# Patient Record
Sex: Male | Born: 1960 | Race: Black or African American | Hispanic: No | Marital: Single | Smoking: Current every day smoker
Health system: Southern US, Community
[De-identification: ages and names within clinical notes are randomized; demographics above are authoritative.]

## PROBLEM LIST (undated history)

## (undated) DIAGNOSIS — K611 Rectal abscess: Secondary | ICD-10-CM

## (undated) DIAGNOSIS — K219 Gastro-esophageal reflux disease without esophagitis: Secondary | ICD-10-CM

## (undated) DIAGNOSIS — I10 Essential (primary) hypertension: Secondary | ICD-10-CM

## (undated) HISTORY — PX: NO PAST SURGERIES: SHX2092

## (undated) HISTORY — DX: Rectal abscess: K61.1

---

## 1998-10-25 ENCOUNTER — Emergency Department (HOSPITAL_COMMUNITY): Admission: EM | Admit: 1998-10-25 | Discharge: 1998-10-25 | Payer: Self-pay | Admitting: Emergency Medicine

## 2010-06-07 ENCOUNTER — Ambulatory Visit (HOSPITAL_COMMUNITY)
Admission: RE | Admit: 2010-06-07 | Discharge: 2010-06-07 | Disposition: A | Discharge status: Home / Self Care | Payer: Self-pay | Source: Ambulatory Visit | Attending: Family Medicine | Admitting: Family Medicine

## 2010-06-07 ENCOUNTER — Other Ambulatory Visit (HOSPITAL_COMMUNITY): Payer: Self-pay | Admitting: Family Medicine

## 2010-06-07 DIAGNOSIS — R52 Pain, unspecified: Secondary | ICD-10-CM

## 2010-06-07 DIAGNOSIS — R609 Edema, unspecified: Secondary | ICD-10-CM | POA: Insufficient documentation

## 2010-06-07 DIAGNOSIS — M79609 Pain in unspecified limb: Secondary | ICD-10-CM | POA: Insufficient documentation

## 2011-02-01 ENCOUNTER — Emergency Department (INDEPENDENT_AMBULATORY_CARE_PROVIDER_SITE_OTHER)
Admission: EM | Admit: 2011-02-01 | Discharge: 2011-02-01 | Disposition: A | Discharge status: Home / Self Care | Payer: Self-pay | Source: Home / Self Care | Attending: Family Medicine | Admitting: Family Medicine

## 2011-02-01 DIAGNOSIS — L0231 Cutaneous abscess of buttock: Secondary | ICD-10-CM

## 2011-02-01 DIAGNOSIS — L03317 Cellulitis of buttock: Secondary | ICD-10-CM

## 2011-02-01 HISTORY — DX: Essential (primary) hypertension: I10

## 2011-02-01 MED ORDER — DOXYCYCLINE HYCLATE 100 MG PO CAPS: 100.0000 mg | ORAL_CAPSULE | Freq: Two times a day (BID) | ORAL | Status: AC

## 2011-02-01 NOTE — ED Notes (Signed)
C/o 1 week duration of pain and swelling in area of his buttocks, and cannot stand it anymore; has ben treating e hot water soaks

## 2011-02-01 NOTE — ED Provider Notes (Signed)
History     CSN: [id number] Arrival date & time: 02/01/2011  1:47 PM   First MD Initiated Contact with Patient 02/01/11 1405      Chief Complaint  Patient presents with  . Recurrent Skin Infections    (Consider location/radiation/quality/duration/timing/severity/associated sxs/prior treatment) HPI  Past Medical History  Diagnosis Date  . Hypertension     History reviewed. No pertinent past surgical history.  History reviewed. No pertinent family history.  History  Substance Use Topics  . Smoking status: Current Everyday Smoker  . Smokeless tobacco: Not on file  . Alcohol Use: Yes      Review of Systems  Constitutional: Negative.   HENT: Negative.   Respiratory: Negative.   Cardiovascular: Negative.   Gastrointestinal: Negative.   Genitourinary: Negative.     Allergies  Review of patient's allergies indicates no known allergies.  Home Medications   Current Outpatient Rx  Name Route Sig Dispense Refill  . DOXYCYCLINE HYCLATE 100 MG PO CAPS Oral Take 1 capsule (100 mg total) by mouth 2 (two) times daily. 20 capsule 0    BP 158/102  Pulse 80  Temp(Src) 98.4 F (36.9 C) (Oral)  Resp 17  SpO2 99%  Physical Exam  Constitutional: He appears well-developed and well-nourished. No distress.  Cardiovascular: Normal rate.   Pulmonary/Chest: Effort normal.  Skin:       fluctuant abscess left buttock    ED Course  INCISION AND DRAINAGE Date/Time: 02/01/2011 2:48 PM Performed by: Randa Spike Authorized by: Randa Spike Consent: Verbal consent obtained. Risks and benefits: risks, benefits and alternatives were discussed Consent given by: patient Patient identity confirmed: verbally with patient and arm band Type: abscess Body area: anogenital (left buttock) Anesthesia: local infiltration Local anesthetic: lidocaine 2% with epinephrine Patient sedated: no Scalpel size: 11 Incision type: single straight Complexity: simple Drainage:  purulent Wound treatment: wound left open Patient tolerance: Patient tolerated the procedure well with no immediate complications. Comments: Culture collected. Gauze dressing applied   (including critical care time)   Labs Reviewed  CULTURE, ROUTINE-ABSCESS   No results found.   1. Abscess of buttock, left       MDM          Randa Spike, MD 02/01/11 1455

## 2011-02-04 LAB — CULTURE, ROUTINE-ABSCESS

## 2011-02-05 NOTE — ED Notes (Signed)
Labs and medications reviewed.  Abscess culture showed few staph. species ( coagulase neg.).  Pt. treated with Doxycycline and had an I and D done. Discussed with Esperanza Sheets PA and she said tx. is adequate. Vassie Moselle 02/05/2011

## 2012-06-28 ENCOUNTER — Encounter (HOSPITAL_COMMUNITY): Payer: Self-pay | Admitting: Adult Health

## 2012-06-28 ENCOUNTER — Emergency Department (HOSPITAL_COMMUNITY)
Admission: EM | Admit: 2012-06-28 | Discharge: 2012-06-29 | Disposition: A | Discharge status: Home / Self Care | Payer: Self-pay | Attending: Emergency Medicine | Admitting: Emergency Medicine

## 2012-06-28 DIAGNOSIS — R0989 Other specified symptoms and signs involving the circulatory and respiratory systems: Secondary | ICD-10-CM

## 2012-06-28 DIAGNOSIS — K219 Gastro-esophageal reflux disease without esophagitis: Secondary | ICD-10-CM | POA: Insufficient documentation

## 2012-06-28 DIAGNOSIS — I10 Essential (primary) hypertension: Secondary | ICD-10-CM | POA: Insufficient documentation

## 2012-06-28 DIAGNOSIS — R1310 Dysphagia, unspecified: Secondary | ICD-10-CM | POA: Insufficient documentation

## 2012-06-28 DIAGNOSIS — R6889 Other general symptoms and signs: Secondary | ICD-10-CM | POA: Insufficient documentation

## 2012-06-28 DIAGNOSIS — F17200 Nicotine dependence, unspecified, uncomplicated: Secondary | ICD-10-CM | POA: Insufficient documentation

## 2012-06-28 HISTORY — DX: Gastro-esophageal reflux disease without esophagitis: K21.9

## 2012-06-28 NOTE — ED Notes (Signed)
Reports a choking episode while eating chicken at work this evening, stated, "I just couldn't get it done. Now I feel better" VS WNL, airway clear.

## 2012-06-29 MED ORDER — FAMOTIDINE 20 MG PO TABS
20.0000 mg | ORAL_TABLET | Freq: Once | ORAL | Status: AC
  Administered 2012-06-29: 20 mg via ORAL
  Filled 2012-06-29: qty 1

## 2012-06-29 MED ORDER — FAMOTIDINE 40 MG PO TABS: 20.0000 mg | ORAL_TABLET | Freq: Two times a day (BID) | ORAL | Status: DC

## 2012-06-29 NOTE — ED Provider Notes (Signed)
History     CSN: [id number]  Arrival date & time 06/28/12  2112   First MD Initiated Contact with Patient 06/29/12 0007      Chief Complaint  Patient presents with  . Choking    (Consider location/radiation/quality/duration/timing/severity/associated sxs/prior treatment) HPI Comments: Earlier today, while eating chicken.  Patient and felt like he could not swallow.  It since that time has resolved.  He, states he has a history of gastric reflux, disease, for, which she's only been taking over-the-counter TUMS or Rolaids and he thinks that this may have been flared up.  He does not have a local doctor has never seen a GI specialist for this  He has had difficulty swallowing, food, and with similar presentation of feeling, like it stuck in either he vomits it out or it passes.  He's never had to come to the hospital before for symptoms  The history is provided by the patient.    Past Medical History  Diagnosis Date  . Hypertension   . GERD (gastroesophageal reflux disease)     History reviewed. No pertinent past surgical history.  History reviewed. No pertinent family history.  History  Substance Use Topics  . Smoking status: Current Every Day Smoker  . Smokeless tobacco: Not on file  . Alcohol Use: Yes      Review of Systems  Constitutional: Negative for fever.  HENT: Positive for trouble swallowing. Negative for congestion and sore throat.   Gastrointestinal: Negative for nausea, vomiting and abdominal pain.  All other systems reviewed and are negative.    Allergies  Review of patient's allergies indicates no known allergies.  Home Medications   Current Outpatient Rx  Name  Route  Sig  Dispense  Refill  . famotidine (PEPCID) 40 MG tablet   Oral   Take 0.5 tablets (20 mg total) by mouth 2 (two) times daily.   40 tablet   0     1 tab BID for 7 days than daily     BP 150/94  Pulse 77  Temp(Src) 98.6 F (37 C) (Oral)  Resp 18  SpO2 98%  Physical  Exam  Nursing note and vitals reviewed. Constitutional: He is oriented to person, place, and time. He appears well-developed.  HENT:  Head: Normocephalic.  Mouth/Throat: Oropharynx is clear and moist.  Patient was given a glass of water, is able to drink without difficulty no choking episodes  Eyes: Pupils are equal, round, and reactive to light.  Neck: Normal range of motion.  Cardiovascular: Normal rate and regular rhythm.   Abdominal: Soft.  Musculoskeletal: Normal range of motion.  Lymphadenopathy:    He has no cervical adenopathy.  Neurological: He is alert and oriented to person, place, and time.  Skin: Skin is warm. No erythema.    ED Course  Procedures (including critical care time)  Labs Reviewed - No data to display No results found.   1. Choking episode   2. GERD (gastroesophageal reflux disease)       MDM   Patient is able to drink a full glass of water, without shortness of breath, or nausea.  I will start him on Pepcid twice a day for 1 week, then daily thereafter, and refer him.  GI for further evaluation        Arman Filter, NP 06/29/12 0109  Arman Filter, NP 06/29/12 0110

## 2012-06-29 NOTE — ED Provider Notes (Signed)
Medical screening examination/treatment/procedure(s) were performed by non-physician practitioner and as supervising physician I was immediately available for consultation/collaboration.  Jones Skene, M.D.   Jones Skene, MD 06/29/12 [phone]

## 2016-07-24 ENCOUNTER — Ambulatory Visit (HOSPITAL_COMMUNITY)
Admission: EM | Admit: 2016-07-24 | Discharge: 2016-07-24 | Disposition: A | Discharge status: Home / Self Care | Payer: PRIVATE HEALTH INSURANCE | Attending: Family Medicine | Admitting: Family Medicine

## 2016-07-24 ENCOUNTER — Encounter (HOSPITAL_COMMUNITY): Payer: Self-pay | Admitting: Emergency Medicine

## 2016-07-24 DIAGNOSIS — L0291 Cutaneous abscess, unspecified: Secondary | ICD-10-CM

## 2016-07-24 MED ORDER — DOXYCYCLINE HYCLATE 100 MG PO TABS: 100.0000 mg | ORAL_TABLET | Freq: Two times a day (BID) | ORAL | 0 refills | Status: DC

## 2016-07-24 MED ORDER — LIDOCAINE-EPINEPHRINE (PF) 2 %-1:200000 IJ SOLN
INTRAMUSCULAR | Status: AC
  Filled 2016-07-24: qty 20

## 2016-07-24 NOTE — Discharge Instructions (Signed)
Please return in 1 to 2 days to recheck the infected area.  Please sit in warm water every few hours today and tomorrow.

## 2016-07-24 NOTE — ED Triage Notes (Signed)
[facility] a [facility] possible abscess on his left buttock x 4 days.

## 2016-07-24 NOTE — ED Provider Notes (Signed)
[facility]    CSN: [id number] Arrival date & time: 07/24/16  1252     History   Chief Complaint Chief Complaint  Patient presents with  . Abscess    HPI Erik Clay is a 56 y.o. male.   56 yo man with recurrence of perineal abscess over the last 3 days.      Past Medical History:  Diagnosis Date  . GERD (gastroesophageal reflux disease)   . Hypertension     There are no active problems to display for this patient.   History reviewed. No pertinent surgical history.     Home Medications    Prior to Admission medications   Medication Sig Start Date End Date Taking? Authorizing Provider  doxycycline (VIBRA-TABS) 100 MG tablet Take 1 tablet (100 mg total) by mouth 2 (two) times daily. 07/24/16   Elvina Sidle, MD    Family History History reviewed. No pertinent family history.  Social History Social History  Substance Use Topics  . Smoking status: Current Every Day Smoker  . Smokeless tobacco: Not on file  . Alcohol use Yes     Allergies   Patient has no known allergies.   Review of Systems Review of Systems  Genitourinary: Positive for scrotal swelling.  All other systems reviewed and are negative.    Physical Exam Triage Vital Signs ED Triage Vitals  Enc Vitals Group     BP 07/24/16 1335 (!) 175/108     Pulse Rate 07/24/16 1335 90     Resp 07/24/16 1335 18     Temp 07/24/16 1335 98.7 F (37.1 C)     Temp Source 07/24/16 1335 Oral     SpO2 07/24/16 1335 100 %     Weight --      Height --      Head Circumference --      Peak Flow --      Pain Score 07/24/16 1333 10     Pain Loc --      Pain Edu? --      Excl. in GC? --    No data found.   Updated Vital Signs BP (!) 175/108 (BP Location: Left Arm)   Pulse 90   Temp 98.7 F (37.1 C) (Oral)   Resp 18   SpO2 100%    Physical Exam  Constitutional: He is oriented to person, place, and time. He appears well-developed and well-nourished.  Pulmonary/Chest:  Effort normal.  Genitourinary: Rectum normal.  Genitourinary Comments: Left perianal induration, nontender  Musculoskeletal: Normal range of motion.  Neurological: He is alert and oriented to person, place, and time.  Skin:  Swollen tender left perineum  Nursing note and vitals reviewed.    UC Treatments / Results  Labs (all labs ordered are listed, but only abnormal results are displayed) Labs Reviewed - No data to display  EKG  EKG Interpretation None       Radiology No results found.  Procedures .Marland KitchenIncision and Drainage Date/Time: 07/24/2016 2:01 PM Performed by: Elvina Sidle Authorized by: Elvina Sidle   Consent:    Consent obtained:  Verbal   Consent given by:  Patient   Risks discussed:  Infection   Alternatives discussed:  No treatment Location:    Type:  Abscess   Location:  Anogenital   Anogenital location:  Perineum Pre-procedure details:    Skin preparation:  Antiseptic wash Anesthesia (see MAR for exact dosages):    Anesthesia method:  Local infiltration   Local anesthetic:  Lidocaine 2% WITH epi Procedure type:    Complexity:  Simple Procedure details:    Needle aspiration: yes     Needle size:  18 G   Incision depth:  Subcutaneous   Wound management:  Debrided   Drainage:  Purulent   Drainage amount:  Copious   Wound treatment:  Wound left open   Packing materials:  None Post-procedure details:    Patient tolerance of procedure:  Tolerated well, no immediate complications   (including critical care time)  Medications Ordered in UC Medications - No data to display   Initial Impression / Assessment and Plan / UC Course  I have reviewed the triage vital signs and the nursing notes.  Pertinent labs & imaging results that were available during my care of the patient were reviewed by me and considered in my medical decision making (see chart for details).     Final Clinical Impressions(s) / UC Diagnoses   Final diagnoses:    Abscess    New Prescriptions New Prescriptions   DOXYCYCLINE (VIBRA-TABS) 100 MG TABLET    Take 1 tablet (100 mg total) by mouth 2 (two) times daily.     Elvina SidleLauenstein, Quill Grinder, MD 07/24/16 [phone]

## 2016-10-17 ENCOUNTER — Ambulatory Visit (HOSPITAL_COMMUNITY)
Admission: EM | Admit: 2016-10-17 | Discharge: 2016-10-17 | Disposition: A | Discharge status: Home / Self Care | Payer: PRIVATE HEALTH INSURANCE | Attending: Family Medicine | Admitting: Family Medicine

## 2016-10-17 ENCOUNTER — Encounter (HOSPITAL_COMMUNITY): Payer: Self-pay | Admitting: Emergency Medicine

## 2016-10-17 DIAGNOSIS — L0291 Cutaneous abscess, unspecified: Secondary | ICD-10-CM

## 2016-10-17 MED ORDER — DOXYCYCLINE HYCLATE 100 MG PO TABS: 100.0000 mg | ORAL_TABLET | Freq: Two times a day (BID) | ORAL | 0 refills | Status: DC

## 2016-10-17 NOTE — ED Triage Notes (Signed)
PT has recurrent abscess on left buttocks. This flare up has been going on for 2 days.

## 2016-10-20 ENCOUNTER — Encounter (HOSPITAL_COMMUNITY): Payer: Self-pay | Admitting: Family Medicine

## 2016-10-20 ENCOUNTER — Ambulatory Visit (HOSPITAL_COMMUNITY)
Admission: EM | Admit: 2016-10-20 | Discharge: 2016-10-20 | Disposition: A | Discharge status: Home / Self Care | Payer: PRIVATE HEALTH INSURANCE | Attending: Family Medicine | Admitting: Family Medicine

## 2016-10-20 DIAGNOSIS — I10 Essential (primary) hypertension: Secondary | ICD-10-CM

## 2016-10-20 DIAGNOSIS — L02215 Cutaneous abscess of perineum: Secondary | ICD-10-CM | POA: Diagnosis not present

## 2016-10-20 MED ORDER — AMLODIPINE BESYLATE 5 MG PO TABS: 5.0000 mg | ORAL_TABLET | Freq: Every day | ORAL | 3 refills | Status: DC

## 2016-10-20 NOTE — ED Triage Notes (Addendum)
Pt here for abscess follow up. Pt  Hypertensive with hx of HTN. sts that he hasn't had a medication in years. sts that he is taking abx from last abscess he had.

## 2016-10-20 NOTE — ED Provider Notes (Signed)
[facility]    CSN: [id number] Arrival date & time: 10/20/16  1203     History   Chief Complaint Chief Complaint  Patient presents with  . Follow-up    HPI Erik Clay is a 56 y.o. male.   Pt here for abscess follow up. Pt  Hypertensive with hx of HTN. sts that he hasn't had a medication in years. sts that he is taking abx from last abscess he had.   Patient would also like treatment for his high blood pressure. He knows that he's had elevated pressures and needs to be on medication. He does not have a primary care doctor and would like a referral.  Patient has no shortness of breath, edema, chest pain      Past Medical History:  Diagnosis Date  . GERD (gastroesophageal reflux disease)   . Hypertension     There are no active problems to display for this patient.   History reviewed. No pertinent surgical history.     Home Medications    Prior to Admission medications   Medication Sig Start Date End Date Taking? Authorizing Provider  amLODipine (NORVASC) 5 MG tablet Take 1 tablet (5 mg total) by mouth daily. 10/20/16   Elvina Sidle, MD  doxycycline (VIBRA-TABS) 100 MG tablet Take 1 tablet (100 mg total) by mouth 2 (two) times daily. 10/17/16   Mardella Layman, MD    Family History History reviewed. No pertinent family history.  Social History Social History  Substance Use Topics  . Smoking status: Current Every Day Smoker    Packs/day: 0.50    Types: Cigarettes  . Smokeless tobacco: Never Used  . Alcohol use Yes     Allergies   Patient has no known allergies.   Review of Systems Review of Systems  All other systems reviewed and are negative.    Physical Exam Triage Vital Signs ED Triage Vitals [10/20/16 1239]  Enc Vitals Group     BP (!) 190/118     Pulse Rate 74     Resp 18     Temp 98.6 F (37 C)     Temp src      SpO2 98 %     Weight      Height      Head Circumference      Peak Flow      Pain Score      Pain  Loc      Pain Edu?      Excl. in GC?    No data found.   Updated Vital Signs BP (!) 190/118   Pulse 74   Temp 98.6 F (37 C)   Resp 18   SpO2 98%   Visual Acuity Right Eye Distance:   Left Eye Distance:   Bilateral Distance:    Right Eye Near:   Left Eye Near:    Bilateral Near:     Physical Exam  Constitutional: He is oriented to person, place, and time. He appears well-developed and well-nourished.  HENT:  Right Ear: External ear normal.  Left Ear: External ear normal.  Eyes:  Right esophoria  Neck: Normal range of motion. Neck supple.  Pulmonary/Chest: Effort normal.  Musculoskeletal: Normal range of motion.  Neurological: He is alert and oriented to person, place, and time.  Skin:  Packing removed revealing clean base and left perineum  Nursing note and vitals reviewed.    UC Treatments / Results  Labs (all labs ordered are listed, but only  abnormal results are displayed) Labs Reviewed - No data to display  EKG  EKG Interpretation None       Radiology No results found.  Procedures Procedures (including critical care time)  Medications Ordered in UC Medications - No data to display   Initial Impression / Assessment and Plan / UC Course  I have reviewed the triage vital signs and the nursing notes.  Pertinent labs & imaging results that were available during my care of the patient were reviewed by me and considered in my medical decision making (see chart for details).     Final Clinical Impressions(s) / UC Diagnoses   Final diagnoses:  Abscess, perineum  Essential hypertension    New Prescriptions New Prescriptions   AMLODIPINE (NORVASC) 5 MG TABLET    Take 1 tablet (5 mg total) by mouth daily.     Controlled Substance Prescriptions [place] Controlled Substance Registry consulted? Not Applicable   Elvina Sidle, MD 10/20/16 1304

## 2016-10-20 NOTE — Discharge Instructions (Signed)
I want you to shower well twice a day and wash the abscess site carefully with soap and water.  Please finish her antibiotics and return in one week.

## 2016-10-22 NOTE — ED Provider Notes (Signed)
  [facility]   [id number] 10/17/16 Arrival Time: 1820  ASSESSMENT & PLAN:  1. Abscess     Meds ordered this encounter  Medications  . doxycycline (VIBRA-TABS) 100 MG tablet    Sig: Take 1 tablet (100 mg total) by mouth 2 (two) times daily.    Dispense:  20 tablet    Refill:  0    Procedure: Verbal consent obtained. Area over induration cleaned with betadine. Lidocaine 2% without epinephrine used to obtain local anesthesia. The most fluctuant portion of the abscess was incised with a #11 blade scalpel. Abscess cavity explored and evacuated. All loculations were broken up with a curved hemostat as best as possible given patient discomfort. Cavity packed with packing material and dressed with a clean gauze dressing. Minimal bleeding. No complications. Start on antibiotics.  Wound care instructions discussed and given in written format. To return in 48 hours for wound check.  Prefers OTC analgesics.  Reviewed expectations re: course of current medical issues. Questions answered. Outlined signs and symptoms indicating need for more acute intervention. Patient verbalized understanding. After Visit Summary given.   SUBJECTIVE:  Erik Clay is a 56 y.o. male who presents with a possible abscess of left buttock. Similar in the past requiring I&D. Has noticed pain in area for 2 days. No drainage. Afebrile. Ambulatory.  ROS: As per HPI.   OBJECTIVE:  Vitals:   10/17/16 1902  BP: (!) 152/105  Pulse: 99  Resp: 20  Temp: (!) 100.5 F (38.1 C)  TempSrc: Temporal  SpO2: 97%  Weight: 135 lb (61.2 kg)  Height: 5\' 9"  (1.753 m)     General appearance: alert; appears to be in pain Skin: large abscess of lower L buttock and perineum; approx 8cm x 4cm; fluctuant and tender; no drainage; slight overlying erythema  Past Medical History:  Diagnosis Date  . GERD (gastroesophageal reflux disease)   . Hypertension     No Known Allergies  PMHx, SurgHx, SocialHx,  Medications, and Allergies were reviewed in the Visit Navigator and updated as appropriate.       Mardella Layman, MD 10/22/16 [phone]

## 2016-10-27 ENCOUNTER — Ambulatory Visit (HOSPITAL_COMMUNITY)
Admission: EM | Admit: 2016-10-27 | Discharge: 2016-10-27 | Disposition: A | Discharge status: Home / Self Care | Payer: PRIVATE HEALTH INSURANCE | Attending: Internal Medicine | Admitting: Internal Medicine

## 2016-10-27 ENCOUNTER — Encounter (HOSPITAL_COMMUNITY): Payer: Self-pay | Admitting: *Deleted

## 2016-10-27 DIAGNOSIS — L02215 Cutaneous abscess of perineum: Secondary | ICD-10-CM | POA: Diagnosis not present

## 2016-10-27 DIAGNOSIS — Z5189 Encounter for other specified aftercare: Secondary | ICD-10-CM | POA: Diagnosis not present

## 2016-10-27 NOTE — ED Triage Notes (Signed)
Presents for f/u of abscess and new HTN meds.  States abscess is doing much better; taking abx as prescribed.  Also taking HTN med and has new f/u with PCP scheduled for next week.

## 2016-10-27 NOTE — ED Provider Notes (Signed)
[facility]    CSN: [id number] Arrival date & time: 10/27/16  1255     History   Chief Complaint Chief Complaint  Patient presents with  . Follow-up    HPI Erik Clay is a 56 y.o. male.   56 year old male comes in for follow-up of perineum abscess wound recheck as well as hypertension. Patient states he has been taking antibiotics as directed. Continues to keep wound clean and dry. Has not noticed any discharge, increased warmth, increased pain. States he has been feeling a lot better, and does not have any pain. Denies fever, chills, night sweats. He has also been taking amlodipine as directed, and has an appointment with PCP. He denies any chest pain, shortness of breath, palpitations, dizziness, weakness, leg swelling.      Past Medical History:  Diagnosis Date  . GERD (gastroesophageal reflux disease)   . Hypertension     There are no active problems to display for this patient.   History reviewed. No pertinent surgical history.     Home Medications    Prior to Admission medications   Medication Sig Start Date End Date Taking? Authorizing Provider  amLODipine (NORVASC) 5 MG tablet Take 1 tablet (5 mg total) by mouth daily. 10/20/16   Elvina Sidle, MD  doxycycline (VIBRA-TABS) 100 MG tablet Take 1 tablet (100 mg total) by mouth 2 (two) times daily. 10/17/16   Mardella Layman, MD    Family History No family history on file.  Social History Social History  Substance Use Topics  . Smoking status: Current Every Day Smoker    Packs/day: 0.50    Types: Cigarettes  . Smokeless tobacco: Never Used  . Alcohol use Yes     Allergies   Patient has no known allergies.   Review of Systems Review of Systems  Reason unable to perform ROS: See HPI as above.     Physical Exam Triage Vital Signs ED Triage Vitals  Enc Vitals Group     BP 10/27/16 1310 (!) 152/101     Pulse Rate 10/27/16 1310 76     Resp 10/27/16 1310 16     Temp 10/27/16  1310 98.7 F (37.1 C)     Temp Source 10/27/16 1310 Oral     SpO2 10/27/16 1310 96 %     Weight --      Height --      Head Circumference --      Peak Flow --      Pain Score 10/27/16 1311 0     Pain Loc --      Pain Edu? --      Excl. in GC? --    No data found.   Updated Vital Signs BP (!) 152/101   Pulse 76   Temp 98.7 F (37.1 C) (Oral)   Resp 16   SpO2 96%     Physical Exam  Constitutional: He is oriented to person, place, and time. He appears well-developed and well-nourished. No distress.  Neurological: He is alert and oriented to person, place, and time.  Skin: Skin is warm and dry.  1cm incision site at the perineum. Wound is clean and dry. No obvious swelling, erythema, warmth. No pain on palpation.      UC Treatments / Results  Labs (all labs ordered are listed, but only abnormal results are displayed) Labs Reviewed - No data to display  EKG  EKG Interpretation None       Radiology No results found.  Procedures Procedures (including critical care time)  Medications Ordered in UC Medications - No data to display   Initial Impression / Assessment and Plan / UC Course  I have reviewed the triage vital signs and the nursing notes.  Pertinent labs & imaging results that were available during my care of the patient were reviewed by me and considered in my medical decision making (see chart for details).    Incision site for abscess healing well. No signs of infection. Patient to finish antibiotics as directed. Patient with significantly improved blood pressure, follow up with PCP as   scheduled for further management. Return precautions given.  Final Clinical Impressions(s) / UC Diagnoses   Final diagnoses:  Wound check, abscess    New Prescriptions Discharge Medication List as of 10/27/2016  1:22 PM         Belinda Fisher, PA-C 10/27/16 1343

## 2016-10-27 NOTE — Discharge Instructions (Signed)
Your incision site is healing nicely without signs of infection. Continue wound care as directed. Continue antibiotic as directed. If experiencing worsening of symptoms, fever, spreading erythema, increased warmth, increased pain, follow-up for reevaluation. Otherwise wound check with PCP as scheduled.  Your blood pressure has significantly improved since last visit. Continue medication as directed and follow up as scheduled with PCP. If experiencing headache, dizziness, weakness, chest pain, shortness of breath, follow-up for reevaluation. Otherwise follow-up with PCP for further management of hypertension.

## 2016-11-01 ENCOUNTER — Encounter: Payer: Self-pay | Admitting: Physician Assistant

## 2016-11-01 ENCOUNTER — Ambulatory Visit: Payer: PRIVATE HEALTH INSURANCE | Attending: Internal Medicine | Admitting: Physician Assistant

## 2016-11-01 VITALS — BP 125/83 | HR 86 | Temp 98.2°F | Resp 18 | Ht 69.0 in | Wt 107.0 lb

## 2016-11-01 DIAGNOSIS — K219 Gastro-esophageal reflux disease without esophagitis: Secondary | ICD-10-CM | POA: Insufficient documentation

## 2016-11-01 DIAGNOSIS — L02215 Cutaneous abscess of perineum: Secondary | ICD-10-CM | POA: Diagnosis not present

## 2016-11-01 DIAGNOSIS — I10 Essential (primary) hypertension: Secondary | ICD-10-CM | POA: Diagnosis not present

## 2016-11-01 DIAGNOSIS — F17200 Nicotine dependence, unspecified, uncomplicated: Secondary | ICD-10-CM | POA: Diagnosis not present

## 2016-11-01 DIAGNOSIS — L0291 Cutaneous abscess, unspecified: Secondary | ICD-10-CM | POA: Diagnosis present

## 2016-11-01 NOTE — Progress Notes (Signed)
Erik Clay  ZOX:[id number]  [id number]  DOB - 17-Nov-1960  Chief Complaint  Patient presents with  . Abscess  . Hypertension       Subjective:   Erik Clay is a 56 y.o. male here today for establishment of care. He has a history of hypertension, smoking and gastroesophageal reflux disease. He has not seen a primary care provider in 2-3 years secondary to lack of insurance. In May 2018 he presented to urgent care with complaints of pain in the perennial area. His testicles were swollen. He was diagnosed with an abscess. He was started on doxycycline. He was noted to have a systolic blood pressure 175.  He returned to urgent care 10/17/2016 with the same complaint. This time he was febrile at 100.5. His systolic blood pressure was 152. He underwent incision and drainage and was started on over-the-counter pain medications and antibiotics again. He was told to follow-up in 3 days.  On 10/20/2016 he presented for follow-up and his wound was stable. His systolic blood pressure was noted to be 190. He was started on Norvasc 5 mg daily and told to get a primary care provider. He also was Told to return in 2 weeks for repeat wound check.  On 10/27/2016 he returned for his wound check and to have his blood pressure repeated. His wound looked good. His systolic blood pressure come down to 152. No changes were made to his medication regimen.  He states that he feels great. His perennial area is okay. No fevers or chills. Has 4 tablets of antibiotics left. Pain is controlled. No chest pain. No headaches. Compliant with his blood pressure medications. Trying to eat a lower salt diet.   ROS: GEN: denies fever or chills, denies change in weight Skin: denies lesions or rashes HEENT: denies headache, earache, epistaxis, sore throat, or neck pain LUNGS: denies SHOB, dyspnea, PND, orthopnea CV: denies CP or palpitations ABD: denies abd pain, N or V EXT: denies muscle spasms or swelling; no  pain in lower ext, no weakness NEURO: denies numbness or tingling, denies sz, stroke or TIA   ALLERGIES: No Known Allergies  PAST MEDICAL HISTORY: Past Medical History:  Diagnosis Date  . GERD (gastroesophageal reflux disease)   . Hypertension     PAST SURGICAL HISTORY: History reviewed. No pertinent surgical history.  MEDICATIONS AT HOME: Prior to Admission medications   Medication Sig Start Date End Date Taking? Authorizing Provider  amLODipine (NORVASC) 5 MG tablet Take 1 tablet (5 mg total) by mouth daily. 10/20/16  Yes Elvina Sidle, MD  doxycycline (VIBRA-TABS) 100 MG tablet Take 1 tablet (100 mg total) by mouth 2 (two) times daily. 10/17/16  Yes Mardella Layman, MD   Family hx-noncontributory Social hx-unmarried, no children, 1/2 ppd cigarettes  Objective:   Vitals:   11/01/16 1500  BP: 125/83  Pulse: 86  Resp: 18  Temp: 98.2 F (36.8 C)  TempSrc: Oral  SpO2: 95%  Weight: 107 lb (48.5 kg)  Height: 5\' 9"  (1.753 m)    Exam-benign General appearance : Awake, alert, not in any distress. Speech Clear. Not toxic looking HEENT: Atraumatic and Normocephalic, pupils equally reactive to light and accomodation Chest:Good air entry bilaterally, no added sounds  CVS: S1 S2 regular, no murmurs.  Extremities: B/L Lower Ext shows no edema, both legs are warm to touch Neurology: Awake alert, and oriented X 3, CN II-XII intact, Non focal Skin:No Rash Wounds:C & D   Assessment & Plan  1. Perineal abscess  -cont  to shower BID and keep clean and dry  -Complete course of antibiotics 2. HTN  -DASH diet   -cont Norvasc 5 mg daily  -recheck BP with nurse in 2 weeks 3. Smoker  -not ready to QUIT  -cessation encouraged    Return in about 2 weeks (around 11/15/2016).  The patient was given clear instructions to go to ER or return to medical center if symptoms don't improve, worsen or new problems develop. The patient verbalized understanding. The patient was told to call  to get lab results if they haven't heard anything in the next week.   This note has been created with Education officer, environmentalDragon speech recognition software and smart phrase technology. Any transcriptional errors are unintentional.    Scot Juniffany Akiva Josey, PA-C [facility] [place], [place] [phone]   11/01/2016, 3:05 PM

## 2016-11-01 NOTE — Patient Instructions (Signed)
 Hypertension Hypertension, commonly called high blood pressure, is when the force of blood pumping through the arteries is too strong. The arteries are the blood vessels that carry blood from the heart throughout the body. Hypertension forces the heart to work harder to pump blood and may cause arteries to become narrow or stiff. Having untreated or uncontrolled hypertension can cause heart attacks, strokes, kidney disease, and other problems. A blood pressure reading consists of a higher number over a lower number. Ideally, your blood pressure should be below 120/80. The first ("top") number is called the systolic pressure. It is a measure of the pressure in your arteries as your heart beats. The second ("bottom") number is called the diastolic pressure. It is a measure of the pressure in your arteries as the heart relaxes. What are the causes? The cause of this condition is not known. What increases the risk? Some risk factors for high blood pressure are under your control. Others are not. Factors you can change  Smoking.  Having type 2 diabetes mellitus, high cholesterol, or both.  Not getting enough exercise or physical activity.  Being overweight.  Having too much fat, sugar, calories, or salt (sodium) in your diet.  Drinking too much alcohol. Factors that are difficult or impossible to change  Having chronic kidney disease.  Having a family history of high blood pressure.  Age. Risk increases with age.  Race. You may be at higher risk if you are African-American.  Gender. Men are at higher risk than women before age 45. After age 65, women are at higher risk than men.  Having obstructive sleep apnea.  Stress. What are the signs or symptoms? Extremely high blood pressure (hypertensive crisis) may cause:  Headache.  Anxiety.  Shortness of breath.  Nosebleed.  Nausea and vomiting.  Severe chest pain.  Jerky movements you cannot control (seizures).  How is  this diagnosed? This condition is diagnosed by measuring your blood pressure while you are seated, with your arm resting on a surface. The cuff of the blood pressure monitor will be placed directly against the skin of your upper arm at the level of your heart. It should be measured at least twice using the same arm. Certain conditions can cause a difference in blood pressure between your right and left arms. Certain factors can cause blood pressure readings to be lower or higher than normal (elevated) for a short period of time:  When your blood pressure is higher when you are in a health care provider's office than when you are at home, this is called white coat hypertension. Most people with this condition do not need medicines.  When your blood pressure is higher at home than when you are in a health care provider's office, this is called masked hypertension. Most people with this condition may need medicines to control blood pressure.  If you have a high blood pressure reading during one visit or you have normal blood pressure with other risk factors:  You may be asked to return on a different day to have your blood pressure checked again.  You may be asked to monitor your blood pressure at home for 1 week or longer.  If you are diagnosed with hypertension, you may have other blood or imaging tests to help your health care provider understand your overall risk for other conditions. How is this treated? This condition is treated by making healthy lifestyle changes, such as eating healthy foods, exercising more, and reducing your alcohol intake.   Your health care provider may prescribe medicine if lifestyle changes are not enough to get your blood pressure under control, and if:  Your systolic blood pressure is above 130.  Your diastolic blood pressure is above 80.  Your personal target blood pressure may vary depending on your medical conditions, your age, and other factors. Follow these  instructions at home: Eating and drinking  Eat a diet that is high in fiber and potassium, and low in sodium, added sugar, and fat. An example eating plan is called the DASH (Dietary Approaches to Stop Hypertension) diet. To eat this way: ? Eat plenty of fresh fruits and vegetables. Try to fill half of your plate at each meal with fruits and vegetables. ? Eat whole grains, such as whole wheat pasta, brown rice, or whole grain bread. Fill about one quarter of your plate with whole grains. ? Eat or drink low-fat dairy products, such as skim milk or low-fat yogurt. ? Avoid fatty cuts of meat, processed or cured meats, and poultry with skin. Fill about one quarter of your plate with lean proteins, such as fish, chicken without skin, beans, eggs, and tofu. ? Avoid premade and processed foods. These tend to be higher in sodium, added sugar, and fat.  Reduce your daily sodium intake. Most people with hypertension should eat less than 1,500 mg of sodium a day.  Limit alcohol intake to no more than 1 drink a day for nonpregnant women and 2 drinks a day for men. One drink equals 12 oz of beer, 5 oz of wine, or 1 oz of hard liquor. Lifestyle  Work with your health care provider to maintain a healthy body weight or to lose weight. Ask what an ideal weight is for you.  Get at least 30 minutes of exercise that causes your heart to beat faster (aerobic exercise) most days of the week. Activities may include walking, swimming, or biking.  Include exercise to strengthen your muscles (resistance exercise), such as pilates or lifting weights, as part of your weekly exercise routine. Try to do these types of exercises for 30 minutes at least 3 days a week.  Do not use any products that contain nicotine or tobacco, such as cigarettes and e-cigarettes. If you need help quitting, ask your health care provider.  Monitor your blood pressure at home as told by your health care provider.  Keep all follow-up visits as  told by your health care provider. This is important. Medicines  Take over-the-counter and prescription medicines only as told by your health care provider. Follow directions carefully. Blood pressure medicines must be taken as prescribed.  Do not skip doses of blood pressure medicine. Doing this puts you at risk for problems and can make the medicine less effective.  Ask your health care provider about side effects or reactions to medicines that you should watch for. Contact a health care provider if:  You think you are having a reaction to a medicine you are taking.  You have headaches that keep coming back (recurring).  You feel dizzy.  You have swelling in your ankles.  You have trouble with your vision. Get help right away if:  You develop a severe headache or confusion.  You have unusual weakness or numbness.  You feel faint.  You have severe pain in your chest or abdomen.  You vomit repeatedly.  You have trouble breathing. Summary  Hypertension is when the force of blood pumping through your arteries is too strong. If this condition is   not controlled, it may put you at risk for serious complications.  Your personal target blood pressure may vary depending on your medical conditions, your age, and other factors. For most people, a normal blood pressure is less than 120/80.  Hypertension is treated with lifestyle changes, medicines, or a combination of both. Lifestyle changes include weight loss, eating a healthy, low-sodium diet, exercising more, and limiting alcohol. This information is not intended to replace advice given to you by your health care provider. Make sure you discuss any questions you have with your health care provider. Document Released: 02/18/2005 Document Revised: 01/17/2016 Document Reviewed: 01/17/2016 Elsevier Interactive Patient Education  2018 Elsevier Inc.   DASH Eating Plan DASH stands for "Dietary Approaches to Stop Hypertension." The  DASH eating plan is a healthy eating plan that has been shown to reduce high blood pressure (hypertension). It may also reduce your risk for type 2 diabetes, heart disease, and stroke. The DASH eating plan may also help with weight loss. What are tips for following this plan? General guidelines  Avoid eating more than 2,300 mg (milligrams) of salt (sodium) a day. If you have hypertension, you may need to reduce your sodium intake to 1,500 mg a day.  Limit alcohol intake to no more than 1 drink a day for nonpregnant women and 2 drinks a day for men. One drink equals 12 oz of beer, 5 oz of wine, or 1 oz of hard liquor.  Work with your health care provider to maintain a healthy body weight or to lose weight. Ask what an ideal weight is for you.  Get at least 30 minutes of exercise that causes your heart to beat faster (aerobic exercise) most days of the week. Activities may include walking, swimming, or biking.  Work with your health care provider or diet and nutrition specialist (dietitian) to adjust your eating plan to your individual calorie needs. Reading food labels  Check food labels for the amount of sodium per serving. Choose foods with less than 5 percent of the Daily Value of sodium. Generally, foods with less than 300 mg of sodium per serving fit into this eating plan.  To find whole grains, look for the word "whole" as the first word in the ingredient list. Shopping  Buy products labeled as "low-sodium" or "no salt added."  Buy fresh foods. Avoid canned foods and premade or frozen meals. Cooking  Avoid adding salt when cooking. Use salt-free seasonings or herbs instead of table salt or sea salt. Check with your health care provider or pharmacist before using salt substitutes.  Do not fry foods. Cook foods using healthy methods such as baking, boiling, grilling, and broiling instead.  Cook with heart-healthy oils, such as olive, canola, soybean, or sunflower oil. Meal  planning   Eat a balanced diet that includes: ? 5 or more servings of fruits and vegetables each day. At each meal, try to fill half of your plate with fruits and vegetables. ? Up to 6-8 servings of whole grains each day. ? Less than 6 oz of lean meat, poultry, or fish each day. A 3-oz serving of meat is about the same size as a deck of cards. One egg equals 1 oz. ? 2 servings of low-fat dairy each day. ? A serving of nuts, seeds, or beans 5 times each week. ? Heart-healthy fats. Healthy fats called Omega-3 fatty acids are found in foods such as flaxseeds and coldwater fish, like sardines, salmon, and mackerel.  Limit how much you   eat of the following: ? Canned or prepackaged foods. ? Food that is high in trans fat, such as fried foods. ? Food that is high in saturated fat, such as fatty meat. ? Sweets, desserts, sugary drinks, and other foods with added sugar. ? Full-fat dairy products.  Do not salt foods before eating.  Try to eat at least 2 vegetarian meals each week.  Eat more home-cooked food and less restaurant, buffet, and fast food.  When eating at a restaurant, ask that your food be prepared with less salt or no salt, if possible. What foods are recommended? The items listed may not be a complete list. Talk with your dietitian about what dietary choices are best for you. Grains Whole-grain or whole-wheat bread. Whole-grain or whole-wheat pasta. Brown rice. Oatmeal. Quinoa. Bulgur. Whole-grain and low-sodium cereals. Pita bread. Low-fat, low-sodium crackers. Whole-wheat flour tortillas. Vegetables Fresh or frozen vegetables (raw, steamed, roasted, or grilled). Low-sodium or reduced-sodium tomato and vegetable juice. Low-sodium or reduced-sodium tomato sauce and tomato paste. Low-sodium or reduced-sodium canned vegetables. Fruits All fresh, dried, or frozen fruit. Canned fruit in natural juice (without added sugar). Meat and other protein foods Skinless chicken or turkey.  Ground chicken or turkey. Pork with fat trimmed off. Fish and seafood. Egg whites. Dried beans, peas, or lentils. Unsalted nuts, nut butters, and seeds. Unsalted canned beans. Lean cuts of beef with fat trimmed off. Low-sodium, lean deli meat. Dairy Low-fat (1%) or fat-free (skim) milk. Fat-free, low-fat, or reduced-fat cheeses. Nonfat, low-sodium ricotta or cottage cheese. Low-fat or nonfat yogurt. Low-fat, low-sodium cheese. Fats and oils Soft margarine without trans fats. Vegetable oil. Low-fat, reduced-fat, or light mayonnaise and salad dressings (reduced-sodium). Canola, safflower, olive, soybean, and sunflower oils. Avocado. Seasoning and other foods Herbs. Spices. Seasoning mixes without salt. Unsalted popcorn and pretzels. Fat-free sweets. What foods are not recommended? The items listed may not be a complete list. Talk with your dietitian about what dietary choices are best for you. Grains Baked goods made with fat, such as croissants, muffins, or some breads. Dry pasta or rice meal packs. Vegetables Creamed or fried vegetables. Vegetables in a cheese sauce. Regular canned vegetables (not low-sodium or reduced-sodium). Regular canned tomato sauce and paste (not low-sodium or reduced-sodium). Regular tomato and vegetable juice (not low-sodium or reduced-sodium). Pickles. Olives. Fruits Canned fruit in a light or heavy syrup. Fried fruit. Fruit in cream or butter sauce. Meat and other protein foods Fatty cuts of meat. Ribs. Fried meat. Bacon. Sausage. Bologna and other processed lunch meats. Salami. Fatback. Hotdogs. Bratwurst. Salted nuts and seeds. Canned beans with added salt. Canned or smoked fish. Whole eggs or egg yolks. Chicken or turkey with skin. Dairy Whole or 2% milk, cream, and half-and-half. Whole or full-fat cream cheese. Whole-fat or sweetened yogurt. Full-fat cheese. Nondairy creamers. Whipped toppings. Processed cheese and cheese spreads. Fats and oils Butter. Stick  margarine. Lard. Shortening. Ghee. Bacon fat. Tropical oils, such as coconut, palm kernel, or palm oil. Seasoning and other foods Salted popcorn and pretzels. Onion salt, garlic salt, seasoned salt, table salt, and sea salt. Worcestershire sauce. Tartar sauce. Barbecue sauce. Teriyaki sauce. Soy sauce, including reduced-sodium. Steak sauce. Canned and packaged gravies. Fish sauce. Oyster sauce. Cocktail sauce. Horseradish that you find on the shelf. Ketchup. Mustard. Meat flavorings and tenderizers. Bouillon cubes. Hot sauce and Tabasco sauce. Premade or packaged marinades. Premade or packaged taco seasonings. Relishes. Regular salad dressings. Where to find more information:  National Heart, Lung, and Blood Institute: www.nhlbi.nih.gov  American   Heart Association: www.heart.org Summary  The DASH eating plan is a healthy eating plan that has been shown to reduce high blood pressure (hypertension). It may also reduce your risk for type 2 diabetes, heart disease, and stroke.  With the DASH eating plan, you should limit salt (sodium) intake to 2,300 mg a day. If you have hypertension, you may need to reduce your sodium intake to 1,500 mg a day.  When on the DASH eating plan, aim to eat more fresh fruits and vegetables, whole grains, lean proteins, low-fat dairy, and heart-healthy fats.  Work with your health care provider or diet and nutrition specialist (dietitian) to adjust your eating plan to your individual calorie needs. This information is not intended to replace advice given to you by your health care provider. Make sure you discuss any questions you have with your health care provider. Document Released: 02/07/2011 Document Revised: 02/12/2016 Document Reviewed: 02/12/2016 Elsevier Interactive Patient Education  2017 Elsevier Inc.  

## 2016-12-02 ENCOUNTER — Encounter: Payer: Self-pay | Admitting: Family Medicine

## 2016-12-02 ENCOUNTER — Ambulatory Visit: Payer: PRIVATE HEALTH INSURANCE | Attending: Family Medicine | Admitting: Family Medicine

## 2016-12-02 VITALS — BP 139/98 | HR 89 | Temp 98.4°F | Resp 18 | Ht 69.0 in | Wt 109.2 lb

## 2016-12-02 DIAGNOSIS — F17210 Nicotine dependence, cigarettes, uncomplicated: Secondary | ICD-10-CM | POA: Diagnosis not present

## 2016-12-02 DIAGNOSIS — K029 Dental caries, unspecified: Secondary | ICD-10-CM | POA: Diagnosis not present

## 2016-12-02 DIAGNOSIS — E785 Hyperlipidemia, unspecified: Secondary | ICD-10-CM | POA: Diagnosis not present

## 2016-12-02 DIAGNOSIS — Z23 Encounter for immunization: Secondary | ICD-10-CM | POA: Diagnosis not present

## 2016-12-02 DIAGNOSIS — K219 Gastro-esophageal reflux disease without esophagitis: Secondary | ICD-10-CM

## 2016-12-02 DIAGNOSIS — I10 Essential (primary) hypertension: Secondary | ICD-10-CM

## 2016-12-02 DIAGNOSIS — Z0000 Encounter for general adult medical examination without abnormal findings: Secondary | ICD-10-CM

## 2016-12-02 DIAGNOSIS — R1310 Dysphagia, unspecified: Secondary | ICD-10-CM

## 2016-12-02 DIAGNOSIS — H5500 Unspecified nystagmus: Secondary | ICD-10-CM

## 2016-12-02 MED ORDER — AMLODIPINE BESYLATE 10 MG PO TABS: 10.0000 mg | ORAL_TABLET | Freq: Every day | ORAL | 2 refills | Status: DC

## 2016-12-02 MED ORDER — OMEPRAZOLE 20 MG PO CPDR: 20.0000 mg | DELAYED_RELEASE_CAPSULE | Freq: Every day | ORAL | 2 refills | Status: DC

## 2016-12-02 MED FILL — ?OMEPRAZOLE DR 20 MG CAPSUL: 20 | 30 days supply | Qty: 30 | Fill #0

## 2016-12-02 NOTE — Progress Notes (Signed)
Patient is here for f/up  

## 2016-12-02 NOTE — Progress Notes (Signed)
Subjective:  Patient ID: Erik Clay, male    DOB: May 11, 1960  Age: 56 y.o. MRN: [id number]  CC: Establish Care   HPI Erik Clay presents to establish care and for follow up. History of HTN. He does not check BP at home. Cardiac symptoms none. Patient denies chest pain, chest pressure/discomfort, dyspnea, near-syncope, palpitations and syncope.  Cardiovascular risk factors: dyslipidemia, hypertension, male gender, sedentary lifestyle and smoking/ tobacco exposure. He smokes 1/2 pack of cigarettes per day. Use of agents associated with hypertension: none. History of target organ damage: none Patient complains of heartburn. This has been associated with belching and dysphagia. He complaints of dysphagias when eating meats.  He denies melena, midespigastric pain and shortness of breath. Symptoms have been present for several months. Medical therapy in the past has included proton pump inhibitors. 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 He reports his last ophthalmological exam was 2 years ago.       Outpatient Medications Prior to Visit  Medication Sig Dispense Refill  . doxycycline (VIBRA-TABS) 100 MG tablet Take 1 tablet (100 mg total) by mouth 2 (two) times daily. 20 tablet 0  . amLODipine (NORVASC) 5 MG tablet Take 1 tablet (5 mg total) by mouth daily. 90 tablet 3   No facility-administered medications prior to visit.     ROS Review of Systems  Constitutional: Negative.   HENT: Positive for trouble swallowing.   Eyes: Positive for visual disturbance.  Respiratory: Negative.   Cardiovascular: Negative.   Gastrointestinal:       Heartburn       Objective:  BP (!) 139/98   Pulse 89   Temp 98.4 F (36.9 C) (Oral)   Resp 18   Ht  (1.753 m)   Wt 109 lb 3.2 oz (49.5 kg)   SpO2 96%   BMI 16.13 kg/m   BP/Weight 12/02/2016 11/01/2016 10/27/2016  Systolic BP 139 125 152  Diastolic BP 98 83 101  Wt. (Lbs) 109.2 107 -  BMI 16.13 15.8 -     Physical Exam    Constitutional: He appears well-developed and well-nourished.  HENT:  Head: Normocephalic and atraumatic.  Right Ear: External ear normal.  Left Ear: External ear normal.  Nose: Nose normal.  Mouth/Throat: Oropharynx is clear and moist.  Eyes: Pupils are equal, round, and reactive to light. Conjunctivae are normal. Right eye exhibits abnormal extraocular motion and nystagmus. Left eye exhibits abnormal extraocular motion and nystagmus.  Neck: No JVD present.  Cardiovascular: Normal rate, regular rhythm, normal heart sounds and intact distal pulses.   Pulmonary/Chest: Effort normal and breath sounds normal.  Abdominal: Soft. Bowel sounds are normal. There is no tenderness.  Skin: Skin is warm and dry.  Nursing note and vitals reviewed.    Assessment & Plan:   1. Essential hypertension Schedule BP recheck in 2 weeks with nurse. If BP is greater than 90/60 (MAP 65 or greater) but not less than 130/80 may add dose of losartan 25 mg QD  and recheck in another 2 weeks.  - amLODipine (NORVASC) 10 MG tablet; Take 1 tablet (10 mg total) by mouth daily.  Dispense: 30 tablet; Refill: 2  2. Gastroesophageal reflux disease, esophagitis presence not specified  - omeprazole (PRILOSEC) 20 MG capsule; Take 1 capsule (20 mg total) by mouth daily.  Dispense: 30 capsule; Refill: 2 - Ambulatory referral to Gastroenterology  3. Dysphagia, unspecified type  - Ambulatory referral to Gastroenterology - SLP modified barium swallow; Future  4. Nystagmus Vision acuity  screen - Ambulatory referral to Ophthalmology - Tdap vaccine greater than or equal to 7yo IM  5. Dental decay  - Ambulatory referral to Dentistry  6. Needs flu shot  - Flu Vaccine QUAD 6+ mos PF IM (Fluarix Quad PF)  7. Healthcare maintenance  - Tdap vaccine greater than or equal to 7yo IM   Meds ordered this encounter  Medications  . omeprazole (PRILOSEC) 20 MG capsule    Sig: Take 1 capsule (20 mg total) by mouth daily.     Dispense:  30 capsule    Refill:  2    Order Specific Question:   Supervising Provider    Answer:   Quentin Angst [id number]  . amLODipine (NORVASC) 10 MG tablet    Sig: Take 1 tablet (10 mg total) by mouth daily.    Dispense:  30 tablet    Refill:  2    Order Specific Question:   Supervising Provider    Answer:   Quentin Angst [id number]    Follow-up: Return in about 2 weeks (around 12/16/2016) for BP check with Travia.   Lizbeth Bark FNP

## 2016-12-02 NOTE — Patient Instructions (Addendum)
Food Choices for Gastroesophageal Reflux Disease, Adult When you have gastroesophageal reflux disease (GERD), the foods you eat and your eating habits are very important. Choosing the right foods can help ease your discomfort. What guidelines do I need to follow?  Choose fruits, vegetables, whole grains, and low-fat dairy products.  Choose low-fat meat, fish, and poultry.  Limit fats such as oils, salad dressings, butter, nuts, and avocado.  Keep a food diary. This helps you identify foods that cause symptoms.  Avoid foods that cause symptoms. These may be different for everyone.  Eat small meals often instead of 3 large meals a day.  Eat your meals slowly, in a place where you are relaxed.  Limit fried foods.  Cook foods using methods other than frying.  Avoid drinking alcohol.  Avoid drinking large amounts of liquids with your meals.  Avoid bending over or lying down until 2-3 hours after eating. What foods are not recommended? These are some foods and drinks that may make your symptoms worse: Vegetables Tomatoes. Tomato juice. Tomato and spaghetti sauce. Chili peppers. Onion and garlic. Horseradish. Fruits Oranges, grapefruit, and lemon (fruit and juice). Meats High-fat meats, fish, and poultry. This includes hot dogs, ribs, ham, sausage, salami, and bacon. Dairy Whole milk and chocolate milk. Sour cream. Cream. Butter. Ice cream. Cream cheese. Drinks Coffee and tea. Bubbly (carbonated) drinks or energy drinks. Condiments Hot sauce. Barbecue sauce. Sweets/Desserts Chocolate and cocoa. Donuts. Peppermint and spearmint. Fats and Oils High-fat foods. This includes [place] fries and potato chips. Other Vinegar. Strong spices. This includes black pepper, white pepper, red pepper, cayenne, curry powder, cloves, ginger, and chili powder. The items listed above may not be a complete list of foods and drinks to avoid. Contact your dietitian for more information. This  information is not intended to replace advice given to you by your health care provider. Make sure you discuss any questions you have with your health care provider. Document Released: 08/20/2011 Document Revised: 07/27/2015 Document Reviewed: 12/23/2012 Elsevier Interactive Patient Education  2017 [employer]  Soft-Food Meal Plan A soft-food meal plan includes foods that are safe and easy to swallow. This meal plan typically is used:  If you are having trouble chewing or swallowing foods.  As a transition meal plan after only having had liquid meals for a long period.  What do I need to know about the soft-food meal plan? A soft-food meal plan includes tender foods that are soft and easy to chew and swallow. In most cases, bite-sized pieces of food are easier to swallow. A bite-sized piece is about  inch or smaller. Foods in this plan do not need to be ground or pureed. Foods that are very hard, crunchy, or sticky should be avoided. Also, breads, cereals, yogurts, and desserts with nuts, seeds, or fruits should be avoided. What foods can I eat? Grains Rice and wild rice. Moist bread, dressing, pasta, and noodles. Well-moistened dry or cooked cereals, such as farina (cooked wheat cereal), oatmeal, or grits. Biscuits, breads, muffins, pancakes, and waffles that have been well moistened. Vegetables Shredded lettuce. Cooked, tender vegetables, including potatoes without skins. Vegetable juices. Broths or creamed soups made with vegetables that are not stringy or chewy. Strained tomatoes (without seeds). Fruits Canned or well-cooked fruits. Soft (ripe), peeled fresh fruits, such as peaches, nectarines, kiwi, cantaloupe, honeydew melon, and watermelon (without seeds). Soft berries with small seeds, such as strawberries. Fruit juices (without pulp). Meats and Other Protein Sources Moist, tender, lean beef. Mutton. Lamb. Veal.  Chicken. [place]. Liver. Ham. Fish without bones. Eggs. Dairy Milk,  milk drinks, and cream. Plain cream cheese and cottage cheese. Plain yogurt. Sweets/Desserts Flavored gelatin desserts. Custard. Plain ice cream, frozen yogurt, sherbet, milk shakes, and malts. Plain cakes and cookies. Plain hard candy. Other Butter, margarine (without trans fat), and cooking oils. Mayonnaise. Cream sauces. Mild spices, salt, and sugar. Syrup, molasses, honey, and jelly. The items listed above may not be a complete list of recommended foods or beverages. Contact your dietitian for more options. What foods are not recommended? Grains Dry bread, toast, crackers that have not been moistened. Coarse or dry cereals, such as bran, granola, and shredded wheat. Tough or chewy crusty breads, such as [place] bread or baguettes. Vegetables Corn. Raw vegetables except shredded lettuce. Cooked vegetables that are tough or stringy. Tough, crisp, fried potatoes and potato skins. Fruits Fresh fruits with skins or seeds or both, such as apples, pears, or grapes. Stringy, high-pulp fruits, such as papaya, pineapple, coconut, or mango. Fruit leather, fruit roll-ups, and all dried fruits. Meats and Other Protein Sources Sausages and hot dogs. Meats with gristle. Fish with bones. Nuts, seeds, and chunky peanut or other nut butters. Sweets/Desserts Cakes or cookies that are very dry or chewy. The items listed above may not be a complete list of foods and beverages to avoid. Contact your dietitian for more information. This information is not intended to replace advice given to you by your health care provider. Make sure you discuss any questions you have with your health care provider. Document Released: 05/28/2007 Document Revised: 07/27/2015 Document Reviewed: 01/15/2013 Elsevier Interactive Patient Education  2017 [employer].

## 2016-12-08 DIAGNOSIS — I10 Essential (primary) hypertension: Secondary | ICD-10-CM | POA: Insufficient documentation

## 2016-12-08 DIAGNOSIS — K219 Gastro-esophageal reflux disease without esophagitis: Secondary | ICD-10-CM | POA: Insufficient documentation

## 2016-12-16 ENCOUNTER — Encounter: Payer: Self-pay | Admitting: Gastroenterology

## 2016-12-16 ENCOUNTER — Ambulatory Visit: Payer: PRIVATE HEALTH INSURANCE | Attending: Family Medicine | Admitting: *Deleted

## 2016-12-16 DIAGNOSIS — I10 Essential (primary) hypertension: Secondary | ICD-10-CM | POA: Diagnosis present

## 2016-12-16 MED ORDER — AMLODIPINE BESYLATE 10 MG PO TABS: 10.0000 mg | ORAL_TABLET | Freq: Every day | ORAL | 0 refills | Status: DC

## 2016-12-16 NOTE — Patient Instructions (Signed)
AmLODipine 10 MG tablet; Take 1 tablet (10 mg total) by mouth daily.   Cut back on smoking and salt intake.

## 2016-12-16 NOTE — Progress Notes (Signed)
Pt arrived to [facility] alert and oriented. Pt arrives in good spirits. Last OV 12/02/2016 with PCP.  Pt denies chest pain, SOB, HA, dizziness, or blurred vision.  Reviewed medication with patient. Pt states medication was taken this morning.  Manual blood pressure reading: 160/98   PLAN- Increase amLODipine (NORVASC) 10 MG tablet; Take 1 tablet (10 mg total) by mouth daily.  Dispense: 30 tablet; Refill: 2  Recheck BP in 2 weeks

## 2016-12-30 ENCOUNTER — Ambulatory Visit: Payer: PRIVATE HEALTH INSURANCE | Attending: Family Medicine | Admitting: *Deleted

## 2016-12-30 VITALS — BP 118/85 | HR 70 | Temp 98.2°F | Resp 18

## 2016-12-30 DIAGNOSIS — Z0489 Encounter for examination and observation for other specified reasons: Secondary | ICD-10-CM | POA: Diagnosis not present

## 2016-12-30 DIAGNOSIS — I10 Essential (primary) hypertension: Secondary | ICD-10-CM | POA: Diagnosis present

## 2016-12-30 MED FILL — AMLODIPINE BESYLATE 10 MG T: 10 | 30 days supply | Qty: 30 | Fill #0

## 2016-12-30 NOTE — Patient Instructions (Signed)
Thank you for visiting the clinic today. Please continue with current dose of 10 mg Amlodipine. A new script was sent to the pharmacy on 12/16/16 for 10 mg prescription.

## 2017-02-06 ENCOUNTER — Encounter: Payer: Self-pay | Admitting: Gastroenterology

## 2017-02-06 ENCOUNTER — Ambulatory Visit (INDEPENDENT_AMBULATORY_CARE_PROVIDER_SITE_OTHER): Payer: PRIVATE HEALTH INSURANCE | Admitting: Gastroenterology

## 2017-02-06 VITALS — BP 130/90 | HR 92 | Ht 68.25 in | Wt 115.0 lb

## 2017-02-06 DIAGNOSIS — R1310 Dysphagia, unspecified: Secondary | ICD-10-CM | POA: Diagnosis not present

## 2017-02-06 DIAGNOSIS — R1319 Other dysphagia: Secondary | ICD-10-CM

## 2017-02-06 DIAGNOSIS — R634 Abnormal weight loss: Secondary | ICD-10-CM | POA: Diagnosis not present

## 2017-02-06 DIAGNOSIS — K219 Gastro-esophageal reflux disease without esophagitis: Secondary | ICD-10-CM | POA: Diagnosis not present

## 2017-02-06 NOTE — Patient Instructions (Signed)
If you are age 54 or older, your body mass index should be between 23-30. Your Body mass index is 17.36 kg/m. If this is out of the aforementioned range listed, please consider follow up with your Primary Care Provider.  If you are age 11064 or younger, your body mass index should be between 19-25. Your Body mass index is 17.36 kg/m. If this is out of the aformentioned range listed, please consider follow up with your Primary Care Provider.   You have been scheduled for an endoscopy. Please follow written instructions given to you at your visit today. If you use inhalers (even only as needed), please bring them with you on the day of your procedure. Your physician has requested that you go to www.startemmi.com and enter the access code given to you at your visit today. This web site gives a general overview about your procedure. However, you should still follow specific instructions given to you by our office regarding your preparation for the procedure.  Thank you for choosing [facility]  Dr Amada JupiterHenry Danis III

## 2017-02-06 NOTE — Progress Notes (Signed)
[facility] Consult Note:  History: Erik LoganRichard A Clay 02/06/2017  Referring physician: Lizbeth BarkHairston, Mandesia R, FNP  Reason for consult/chief complaint: Dysphagia (feels like food is getting stuck) and Gastroesophageal Reflux   Subjective  HPI:  This is a 56 year old man referred by a local health clinic for chronic dysphagia.  He is a somewhat limited historian, and seems to describe feelings of food frequently stuck in the neck or upper chest for about the last year.  He does not seem to feel like it is getting worse.  Initially, he did not think that there is been any weight loss, but when I made mention of his poor muscle mass and extra notches he had made in his belt, he supposed that perhaps he has lost some weight lately.  His voice has not become hoarse, he does have frequent heartburn that seems lately better on omeprazole.  He has been a longtime smoker, he has at least a 40 ounces of beer daily for many years.  He does not have hemoptysis or hematemesis, but he does report frequent sputum production where he feels like phlegm is collecting in the lower part of his throat. He does not seem that he has had regular primary care until recently. His appetite is lately decreased for unknown reasons.  ROS:  Review of Systems  Constitutional: Negative for appetite change and unexpected weight change.  HENT: Negative for mouth sores and voice change.   Eyes: Negative for pain and redness.  Respiratory: Negative for cough and shortness of breath.   Cardiovascular: Negative for chest pain and palpitations.  Genitourinary: Negative for dysuria and hematuria.  Musculoskeletal: Negative for arthralgias and myalgias.  Skin: Negative for pallor and rash.  Neurological: Negative for weakness and headaches.  Hematological: Negative for adenopathy.     Past Medical History: Past Medical History:  Diagnosis Date  . GERD (gastroesophageal reflux disease)   . Hypertension       Past Surgical History: Past Surgical History:  Procedure Laterality Date  . NO PAST SURGERIES       Family History: Family History  Problem Relation Age of Onset  . Diabetes Mother   . Hypertension Mother   . Hypertension Father   . Diabetes Sister   . Hypertension Sister   . Hypertension Sister     Social History: Social History   Socioeconomic History  . Marital status: Single    Spouse name: None  . Number of children: 0  . Years of education: None  . Highest education level: None  Social Needs  . Financial resource strain: None  . Food insecurity - worry: None  . Food insecurity - inability: None  . Transportation needs - medical: None  . Transportation needs - non-medical: None  Occupational History  . None  Tobacco Use  . Smoking status: Current Every Day Smoker    Packs/day: 0.50    Types: Cigarettes  . Smokeless tobacco: Never Used  Substance and Sexual Activity  . Alcohol use: Yes    Comment: 1-2 per day  . Drug use: No  . Sexual activity: None  Other Topics Concern  . None  Social History Narrative  . None  40 oz beer about every day  Allergies: No Known Allergies  Outpatient Meds: Current Outpatient Medications  Medication Sig Dispense Refill  . amLODipine (NORVASC) 10 MG tablet Take 1 tablet (10 mg total) by mouth daily. 90 tablet 0  . omeprazole (PRILOSEC) 20 MG capsule Take 1 capsule (  20 mg total) by mouth daily. 30 capsule 2   No current facility-administered medications for this visit.       ___________________________________________________________________ Objective   Exam:  BP 130/90 (BP Location: Left Arm, Patient Position: Sitting, Cuff Size: Normal)   Pulse 92   Ht 5' 8.25" (1.734 m) Comment: height measured without shoes  Wt 115 lb (52.2 kg)   BMI 17.36 kg/m  Thin with poor muscle mass and temporal wasting   General: this is a(n) chronically ill-appearing man who looks malnourished with temporal  wasting  Eyes: sclera anicteric, no redness  ENT: oral mucosa moist without lesions, no cervical or supraclavicular lymphadenopathy, poor dentition (no bottom teeth, and a half dozen upper teeth in poor condition)  CV: RRR without murmur, S1/S2, no JVD, no peripheral edema  Resp: clear to auscultation bilaterally, normal RR and effort noted  GI: soft, no tenderness, with active bowel sounds. No guarding or palpable organomegaly noted.  Skin; warm and dry, no rash or jaundice noted  Neuro: awake, alert and oriented x 3. Normal gross motor function and fluent speech  Labs:  No recent data, no chest or abdominal imaging studies can be found in the epic system  Assessment: Encounter Diagnoses  Name Primary?  . Esophageal dysphagia Yes  . Gastroesophageal reflux disease, esophagitis presence not specified   . Abnormal loss of weight     56 year old man with long-standing dysphagia, risk factors of tobacco and alcohol use and poorly controlled acid reflux.  He may have other systemic conditions leading to his malnutrition, but I am concerned he could have developed a severe reflux related stricture, esophageal or laryngeal or thoracic malignancy.  Plan:  EGD next week.  He is agreeable after discussion of procedure and risks.  The benefits and risks of the planned procedure were described in detail with the patient or (when appropriate) their health care proxy.  Risks were outlined as including, but not limited to, bleeding, infection, perforation, adverse medication reaction leading to cardiac or pulmonary decompensation, or pancreatitis (if ERCP).  The limitation of incomplete mucosal visualization was also discussed.  No guarantees or warranties were given.  If unremarkable, he may need ENT evaluation and close follow-up with primary care for imaging.  Thank you for the courtesy of this consult.  Please call me with any questions or concerns.  Charlie PitterHenry L Danis III  CC: Lizbeth BarkHairston,  Mandesia R, FNP

## 2017-02-12 ENCOUNTER — Telehealth: Payer: Self-pay | Admitting: Gastroenterology

## 2017-02-12 NOTE — Telephone Encounter (Signed)
Spoke to the patient, he declines to schedule EGD at this time. He has to check with family for transportation. Pt states he will call back when he is ready. Offered next available date in Jan 2019. He declined that as well.

## 2017-02-12 NOTE — Telephone Encounter (Signed)
That is unfortunate, as the endoscopy schedule is very tight for the next few weeks. Please reschedule him to the next available [facility] slot.  - HD

## 2017-02-12 NOTE — Telephone Encounter (Signed)
Again, unfortunate. We will wait to hear from him. We gave him [facility] info in office as well.

## 2017-02-13 ENCOUNTER — Encounter: Payer: PRIVATE HEALTH INSURANCE | Admitting: Gastroenterology

## 2017-02-18 MED FILL — ?OMEPRAZOLE DR 20MG CAPSULE: 20 | 30 days supply | Qty: 30 | Fill #1

## 2017-02-18 MED FILL — AMLODIPINE BESYLATE 10 MG T: 10 | 30 days supply | Qty: 30 | Fill #1

## 2017-03-03 ENCOUNTER — Encounter: Payer: Self-pay | Admitting: Family Medicine

## 2017-03-03 ENCOUNTER — Ambulatory Visit: Payer: PRIVATE HEALTH INSURANCE | Attending: Family Medicine | Admitting: Family Medicine

## 2017-03-03 VITALS — BP 122/81 | HR 87 | Temp 98.2°F | Resp 16 | Ht 68.0 in | Wt 113.0 lb

## 2017-03-03 DIAGNOSIS — Z13220 Encounter for screening for lipoid disorders: Secondary | ICD-10-CM | POA: Diagnosis not present

## 2017-03-03 DIAGNOSIS — Z8669 Personal history of other diseases of the nervous system and sense organs: Secondary | ICD-10-CM

## 2017-03-03 DIAGNOSIS — H547 Unspecified visual loss: Secondary | ICD-10-CM | POA: Insufficient documentation

## 2017-03-03 DIAGNOSIS — E785 Hyperlipidemia, unspecified: Secondary | ICD-10-CM | POA: Diagnosis not present

## 2017-03-03 DIAGNOSIS — K219 Gastro-esophageal reflux disease without esophagitis: Secondary | ICD-10-CM | POA: Insufficient documentation

## 2017-03-03 DIAGNOSIS — K029 Dental caries, unspecified: Secondary | ICD-10-CM | POA: Diagnosis not present

## 2017-03-03 DIAGNOSIS — H5500 Unspecified nystagmus: Secondary | ICD-10-CM | POA: Insufficient documentation

## 2017-03-03 DIAGNOSIS — I10 Essential (primary) hypertension: Secondary | ICD-10-CM | POA: Insufficient documentation

## 2017-03-03 DIAGNOSIS — Z79899 Other long term (current) drug therapy: Secondary | ICD-10-CM | POA: Insufficient documentation

## 2017-03-03 DIAGNOSIS — F17210 Nicotine dependence, cigarettes, uncomplicated: Secondary | ICD-10-CM | POA: Diagnosis not present

## 2017-03-03 DIAGNOSIS — R1310 Dysphagia, unspecified: Secondary | ICD-10-CM | POA: Diagnosis not present

## 2017-03-03 DIAGNOSIS — K08109 Complete loss of teeth, unspecified cause, unspecified class: Secondary | ICD-10-CM | POA: Diagnosis not present

## 2017-03-03 MED ORDER — OMEPRAZOLE 20 MG PO CPDR: 20.0000 mg | DELAYED_RELEASE_CAPSULE | Freq: Every day | ORAL | 2 refills | Status: DC

## 2017-03-03 MED ORDER — AMLODIPINE BESYLATE 10 MG PO TABS: 10.0000 mg | ORAL_TABLET | Freq: Every day | ORAL | 0 refills | Status: DC

## 2017-03-03 NOTE — Progress Notes (Signed)
Subjective:  Patient ID: Erik Loganichard A Beaufort, male    DOB: [birth year]  Age: 56 y.o. MRN: [id number]  CC: Follow-up   HPI Erik Clay presents to establish care and for follow up. History of HTN. He does not check BP at home. Cardiac symptoms none. Patient denies chest pain, chest pressure/discomfort, dyspnea, near-syncope, palpitations and syncope.  Cardiovascular risk factors: dyslipidemia, hypertension, male gender, sedentary lifestyle and smoking/ tobacco exposure. He smokes 1/2 pack of cigarettes per day. Use of agents associated with hypertension: none. History of target organ damage: none History of GERD with dysphagia. Symptoms include  belching and dysphagia. He reports following up with GI referral. He reports scheduling upcoming appointment for Jan. 10th.  He denies melena and hematochezia. Marland Kitchen. He takes omeprazole daily. 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 Wears corrective lenses.He reports his last ophthalmological exam was 2 years ago. Request dental referral for missing teeth and dental decay. Denies any symptoms of abscess.     Outpatient Medications Prior to Visit  Medication Sig Dispense Refill  . amLODipine (NORVASC) 10 MG tablet Take 1 tablet (10 mg total) by mouth daily. 90 tablet 0  . omeprazole (PRILOSEC) 20 MG capsule Take 1 capsule (20 mg total) by mouth daily. 30 capsule 2   No facility-administered medications prior to visit.     ROS Review of Systems  Constitutional: Negative.   HENT: Positive for dental problem.   Eyes: Positive for visual disturbance.  Respiratory: Negative.   Cardiovascular: Negative.   Gastrointestinal:       GERD  Psychiatric/Behavioral: Negative.     Objective:  BP 122/81 (BP Location: Left Arm, Patient Position: Sitting, Cuff Size: Normal)   Pulse 87   Temp 98.2 F (36.8 C) (Oral)   Resp 16   Ht 5\' 8"  (1.727 m)   Wt 113 lb (51.3 kg)   SpO2 97%   BMI 17.18 kg/m   BP/Weight 03/03/2017 02/06/2017 12/30/2016  Systolic BP 122 130  118  Diastolic BP 81 90 85  Wt. (Lbs) 113 115 -  BMI 17.18 17.36 -     Physical Exam  Constitutional: He appears well-developed and well-nourished.  HENT:  Head: Normocephalic and atraumatic.  Right Ear: External ear normal.  Left Ear: External ear normal.  Nose: Nose normal.  Mouth/Throat: Oropharynx is clear and moist. Abnormal dentition (missing/decay).  Eyes: Conjunctivae are normal. Pupils are equal, round, and reactive to light. Right eye exhibits abnormal extraocular motion and nystagmus. Left eye exhibits abnormal extraocular motion and nystagmus.  Neck: No JVD present.  Cardiovascular: Normal rate, regular rhythm, normal heart sounds and intact distal pulses.  Pulmonary/Chest: Effort normal and breath sounds normal.  Abdominal: Soft. Bowel sounds are normal. There is no tenderness.  Skin: Skin is warm and dry.  Psychiatric: He has a normal mood and affect.  Nursing note and vitals reviewed.   Assessment & Plan:   1. Essential hypertension  - amLODipine (NORVASC) 10 MG tablet; Take 1 tablet (10 mg total) by mouth daily.  Dispense: 90 tablet; Refill: 0 - Basic metabolic panel  2. Gastroesophageal reflux disease, esophagitis presence not specified Cont.to follow up with GI specialist. - omeprazole (PRILOSEC) 20 MG capsule; Take 1 capsule (20 mg total) by mouth daily.  Dispense: 30 capsule; Refill: 2  3. History of visual impairment  - Ambulatory referral to Ophthalmology  4. Nystagmus  - Ambulatory referral to Ophthalmology  5. Missing teeth, acquired  - Ambulatory referral to Dentistry  6. Dental decay  -  Ambulatory referral to Dentistry  7. Screening cholesterol level  - Lipid Panel      Follow-up: Return in about 3 months (around 06/01/2017) for HTN/GERD.   Lizbeth BarkMandesia R Taitum Alms FNP

## 2017-03-03 NOTE — Patient Instructions (Signed)
Managing Your Hypertension Hypertension is commonly called high blood pressure. This is when the force of your blood pressing against the walls of your arteries is too strong. Arteries are blood vessels that carry blood from your heart throughout your body. Hypertension forces the heart to work harder to pump blood, and may cause the arteries to become narrow or stiff. Having untreated or uncontrolled hypertension can cause heart attack, stroke, kidney disease, and other problems. What are blood pressure readings? A blood pressure reading consists of a higher number over a lower number. Ideally, your blood pressure should be below 120/80. The first ("top") number is called the systolic pressure. It is a measure of the pressure in your arteries as your heart beats. The second ("bottom") number is called the diastolic pressure. It is a measure of the pressure in your arteries as the heart relaxes. What does my blood pressure reading mean? Blood pressure is classified into four stages. Based on your blood pressure reading, your health care provider may use the following stages to determine what type of treatment you need, if any. Systolic pressure and diastolic pressure are measured in a unit called mm Hg. Normal  Systolic pressure: below 120.  Diastolic pressure: below 80. Elevated  Systolic pressure: 120-129.  Diastolic pressure: below 80. Hypertension stage 1  Systolic pressure: 130-139.  Diastolic pressure: 80-89. Hypertension stage 2  Systolic pressure: 140 or above.  Diastolic pressure: 90 or above. What health risks are associated with hypertension? Managing your hypertension is an important responsibility. Uncontrolled hypertension can lead to:  A heart attack.  A stroke.  A weakened blood vessel (aneurysm).  Heart failure.  Kidney damage.  Eye damage.  Metabolic syndrome.  Memory and concentration problems.  What changes can I make to manage my  hypertension? Hypertension can be managed by making lifestyle changes and possibly by taking medicines. Your health care provider will help you make a plan to bring your blood pressure within a normal range. Eating and drinking  Eat a diet that is high in fiber and potassium, and low in salt (sodium), added sugar, and fat. An example eating plan is called the DASH (Dietary Approaches to Stop Hypertension) diet. To eat this way: ? Eat plenty of fresh fruits and vegetables. Try to fill half of your plate at each meal with fruits and vegetables. ? Eat whole grains, such as whole wheat pasta, brown rice, or whole grain bread. Fill about one quarter of your plate with whole grains. ? Eat low-fat diary products. ? Avoid fatty cuts of meat, processed or cured meats, and poultry with skin. Fill about one quarter of your plate with lean proteins such as fish, chicken without skin, beans, eggs, and tofu. ? Avoid premade and processed foods. These tend to be higher in sodium, added sugar, and fat.  Reduce your daily sodium intake. Most people with hypertension should eat less than 1,500 mg of sodium a day.  Limit alcohol intake to no more than 1 drink a day for nonpregnant women and 2 drinks a day for men. One drink equals 12 oz of beer, 5 oz of wine, or 1 oz of hard liquor. Lifestyle  Work with your health care provider to maintain a healthy body weight, or to lose weight. Ask what an ideal weight is for you.  Get at least 30 minutes of exercise that causes your heart to beat faster (aerobic exercise) most days of the week. Activities may include walking, swimming, or biking.  Include exercise   to strengthen your muscles (resistance exercise), such as weight lifting, as part of your weekly exercise routine. Try to do these types of exercises for 30 minutes at least 3 days a week.  Do not use any products that contain nicotine or tobacco, such as cigarettes and e-cigarettes. If you need help quitting, ask  your health care provider.  Control any long-term (chronic) conditions you have, such as high cholesterol or diabetes. Monitoring  Monitor your blood pressure at home as told by your health care provider. Your personal target blood pressure may vary depending on your medical conditions, your age, and other factors.  Have your blood pressure checked regularly, as often as told by your health care provider. Working with your health care provider  Review all the medicines you take with your health care provider because there may be side effects or interactions.  Talk with your health care provider about your diet, exercise habits, and other lifestyle factors that may be contributing to hypertension.  Visit your health care provider regularly. Your health care provider can help you create and adjust your plan for managing hypertension. Will I need medicine to control my blood pressure? Your health care provider may prescribe medicine if lifestyle changes are not enough to get your blood pressure under control, and if:  Your systolic blood pressure is 130 or higher.  Your diastolic blood pressure is 80 or higher.  Take medicines only as told by your health care provider. Follow the directions carefully. Blood pressure medicines must be taken as prescribed. The medicine does not work as well when you skip doses. Skipping doses also puts you at risk for problems. Contact a health care provider if:  You think you are having a reaction to medicines you have taken.  You have repeated (recurrent) headaches.  You feel dizzy.  You have swelling in your ankles.  You have trouble with your vision. Get help right away if:  You develop a severe headache or confusion.  You have unusual weakness or numbness, or you feel faint.  You have severe pain in your chest or abdomen.  You vomit repeatedly.  You have trouble breathing. Summary  Hypertension is when the force of blood pumping through  your arteries is too strong. If this condition is not controlled, it may put you at risk for serious complications.  Your personal target blood pressure may vary depending on your medical conditions, your age, and other factors. For most people, a normal blood pressure is less than 120/80.  Hypertension is managed by lifestyle changes, medicines, or both. Lifestyle changes include weight loss, eating a healthy, low-sodium diet, exercising more, and limiting alcohol. This information is not intended to replace advice given to you by your health care provider. Make sure you discuss any questions you have with your health care provider. Document Released: 11/13/2011 Document Revised: 01/17/2016 Document Reviewed: 01/17/2016 Elsevier Interactive Patient Education  2018 Elsevier Inc.  

## 2017-03-03 NOTE — Progress Notes (Signed)
Patient is here for a follow-up on hypertension.  

## 2017-03-04 LAB — LIPID PANEL
Chol/HDL Ratio: 2.2 ratio (ref 0.0–5.0)
Cholesterol, Total: 198 mg/dL (ref 100–199)
HDL: 89 mg/dL (ref >39)
LDL CALC: 90 mg/dL (ref 0–99)
Triglycerides: 96 mg/dL (ref 0–149)
VLDL CHOLESTEROL CAL: 19 mg/dL (ref 5–40)

## 2017-03-04 LAB — BASIC METABOLIC PANEL
BUN/Creatinine Ratio: 13 (ref 9–20)
BUN: 14 mg/dL (ref 6–24)
CO2: 23 mmol/L (ref 20–29)
Calcium: 9.6 mg/dL (ref 8.7–10.2)
Chloride: 101 mmol/L (ref 96–106)
Creatinine, Ser: 1.07 mg/dL (ref 0.76–1.27)
GFR, EST AFRICAN AMERICAN: 90 mL/min/{1.73_m2} (ref >59)
GFR, EST NON AFRICAN AMERICAN: 78 mL/min/{1.73_m2} (ref >59)
Glucose: 94 mg/dL (ref 65–99)
Potassium: 4.2 mmol/L (ref 3.5–5.2)
Sodium: 143 mmol/L (ref 134–144)

## 2017-03-06 ENCOUNTER — Telehealth: Payer: Self-pay | Admitting: *Deleted

## 2017-03-06 NOTE — Telephone Encounter (Signed)
-----   Message from Lizbeth BarkMandesia R Hairston, [place] sent at 03/06/2017  2:05 PM EST ----- Kidney function normal Liver function normal Cholesterol levels normal. Eat a diet lower in saturated fat.

## 2017-03-06 NOTE — Telephone Encounter (Signed)
Medical Assistant left message on patient's home and cell voicemail. Voicemail states to give a call back to [place] with [facility] at [phone]. !!Please inform patient of all labs being normal!!!

## 2017-04-08 MED FILL — AMLODIPINE BESYLATE 10 MG T: 10 | 30 days supply | Qty: 30 | Fill #0

## 2017-05-27 ENCOUNTER — Other Ambulatory Visit: Payer: Self-pay

## 2017-05-27 DIAGNOSIS — I10 Essential (primary) hypertension: Secondary | ICD-10-CM

## 2017-05-27 MED ORDER — AMLODIPINE BESYLATE 10 MG PO TABS: 10.0000 mg | ORAL_TABLET | Freq: Every day | ORAL | 0 refills | Status: DC

## 2017-05-27 MED FILL — AMLODIPINE BESYLATE 10 MG T: 10 | 30 days supply | Qty: 30 | Fill #0

## 2017-06-02 ENCOUNTER — Ambulatory Visit: Payer: PRIVATE HEALTH INSURANCE | Attending: Family Medicine | Admitting: Nurse Practitioner

## 2017-06-02 ENCOUNTER — Ambulatory Visit: Payer: PRIVATE HEALTH INSURANCE | Admitting: Family Medicine

## 2017-06-02 ENCOUNTER — Encounter: Payer: Self-pay | Admitting: Nurse Practitioner

## 2017-06-02 VITALS — BP 156/99 | HR 88 | Temp 98.2°F | Ht 68.0 in | Wt 111.2 lb

## 2017-06-02 DIAGNOSIS — F17210 Nicotine dependence, cigarettes, uncomplicated: Secondary | ICD-10-CM | POA: Diagnosis not present

## 2017-06-02 DIAGNOSIS — K210 Gastro-esophageal reflux disease with esophagitis: Secondary | ICD-10-CM | POA: Insufficient documentation

## 2017-06-02 DIAGNOSIS — Z1211 Encounter for screening for malignant neoplasm of colon: Secondary | ICD-10-CM

## 2017-06-02 DIAGNOSIS — K219 Gastro-esophageal reflux disease without esophagitis: Secondary | ICD-10-CM

## 2017-06-02 DIAGNOSIS — Z79899 Other long term (current) drug therapy: Secondary | ICD-10-CM | POA: Insufficient documentation

## 2017-06-02 DIAGNOSIS — I10 Essential (primary) hypertension: Secondary | ICD-10-CM | POA: Diagnosis present

## 2017-06-02 DIAGNOSIS — K089 Disorder of teeth and supporting structures, unspecified: Secondary | ICD-10-CM

## 2017-06-02 DIAGNOSIS — F17200 Nicotine dependence, unspecified, uncomplicated: Secondary | ICD-10-CM | POA: Diagnosis not present

## 2017-06-02 MED ORDER — OMEPRAZOLE 20 MG PO CPDR: 20.0000 mg | DELAYED_RELEASE_CAPSULE | Freq: Every day | ORAL | 1 refills | Status: DC

## 2017-06-02 MED ORDER — NICOTINE 7 MG/24HR TD PT24: 7.0000 mg | MEDICATED_PATCH | Freq: Every day | TRANSDERMAL | 0 refills | Status: DC

## 2017-06-02 MED ORDER — AMLODIPINE BESYLATE 10 MG PO TABS: 10.0000 mg | ORAL_TABLET | Freq: Every day | ORAL | 1 refills | Status: DC

## 2017-06-02 MED ORDER — NICOTINE 14 MG/24HR TD PT24: 14.0000 mg | MEDICATED_PATCH | Freq: Every day | TRANSDERMAL | 0 refills | Status: DC

## 2017-06-02 NOTE — Patient Instructions (Addendum)
 DASH Eating Plan DASH stands for "Dietary Approaches to Stop Hypertension." The DASH eating plan is a healthy eating plan that has been shown to reduce high blood pressure (hypertension). It may also reduce your risk for type 2 diabetes, heart disease, and stroke. The DASH eating plan may also help with weight loss. What are tips for following this plan? General guidelines  Avoid eating more than 2,300 mg (milligrams) of salt (sodium) a day. If you have hypertension, you may need to reduce your sodium intake to 1,500 mg a day.  Limit alcohol intake to no more than 1 drink a day for nonpregnant women and 2 drinks a day for men. One drink equals 12 oz of beer, 5 oz of wine, or 1 oz of hard liquor.  Work with your health care provider to maintain a healthy body weight or to lose weight. Ask what an ideal weight is for you.  Get at least 30 minutes of exercise that causes your heart to beat faster (aerobic exercise) most days of the week. Activities may include walking, swimming, or biking.  Work with your health care provider or diet and nutrition specialist (dietitian) to adjust your eating plan to your individual calorie needs. Reading food labels  Check food labels for the amount of sodium per serving. Choose foods with less than 5 percent of the Daily Value of sodium. Generally, foods with less than 300 mg of sodium per serving fit into this eating plan.  To find whole grains, look for the word "whole" as the first word in the ingredient list. Shopping  Buy products labeled as "low-sodium" or "no salt added."  Buy fresh foods. Avoid canned foods and premade or frozen meals. Cooking  Avoid adding salt when cooking. Use salt-free seasonings or herbs instead of table salt or sea salt. Check with your health care provider or pharmacist before using salt substitutes.  Do not fry foods. Cook foods using healthy methods such as baking, boiling, grilling, and broiling instead.  Cook with  heart-healthy oils, such as olive, canola, soybean, or sunflower oil. Meal planning   Eat a balanced diet that includes: ? 5 or more servings of fruits and vegetables each day. At each meal, try to fill half of your plate with fruits and vegetables. ? Up to 6-8 servings of whole grains each day. ? Less than 6 oz of lean meat, poultry, or fish each day. A 3-oz serving of meat is about the same size as a deck of cards. One egg equals 1 oz. ? 2 servings of low-fat dairy each day. ? A serving of nuts, seeds, or beans 5 times each week. ? Heart-healthy fats. Healthy fats called Omega-3 fatty acids are found in foods such as flaxseeds and coldwater fish, like sardines, salmon, and mackerel.  Limit how much you eat of the following: ? Canned or prepackaged foods. ? Food that is high in trans fat, such as fried foods. ? Food that is high in saturated fat, such as fatty meat. ? Sweets, desserts, sugary drinks, and other foods with added sugar. ? Full-fat dairy products.  Do not salt foods before eating.  Try to eat at least 2 vegetarian meals each week.  Eat more home-cooked food and less restaurant, buffet, and fast food.  When eating at a restaurant, ask that your food be prepared with less salt or no salt, if possible. What foods are recommended? The items listed may not be a complete list. Talk with your dietitian about   what dietary choices are best for you. Grains Whole-grain or whole-wheat bread. Whole-grain or whole-wheat pasta. Brown rice. Oatmeal. Quinoa. Bulgur. Whole-grain and low-sodium cereals. Pita bread. Low-fat, low-sodium crackers. Whole-wheat flour tortillas. Vegetables Fresh or frozen vegetables (raw, steamed, roasted, or grilled). Low-sodium or reduced-sodium tomato and vegetable juice. Low-sodium or reduced-sodium tomato sauce and tomato paste. Low-sodium or reduced-sodium canned vegetables. Fruits All fresh, dried, or frozen fruit. Canned fruit in natural juice (without  added sugar). Meat and other protein foods Skinless chicken or turkey. Ground chicken or turkey. Pork with fat trimmed off. Fish and seafood. Egg whites. Dried beans, peas, or lentils. Unsalted nuts, nut butters, and seeds. Unsalted canned beans. Lean cuts of beef with fat trimmed off. Low-sodium, lean deli meat. Dairy Low-fat (1%) or fat-free (skim) milk. Fat-free, low-fat, or reduced-fat cheeses. Nonfat, low-sodium ricotta or cottage cheese. Low-fat or nonfat yogurt. Low-fat, low-sodium cheese. Fats and oils Soft margarine without trans fats. Vegetable oil. Low-fat, reduced-fat, or light mayonnaise and salad dressings (reduced-sodium). Canola, safflower, olive, soybean, and sunflower oils. Avocado. Seasoning and other foods Herbs. Spices. Seasoning mixes without salt. Unsalted popcorn and pretzels. Fat-free sweets. What foods are not recommended? The items listed may not be a complete list. Talk with your dietitian about what dietary choices are best for you. Grains Baked goods made with fat, such as croissants, muffins, or some breads. Dry pasta or rice meal packs. Vegetables Creamed or fried vegetables. Vegetables in a cheese sauce. Regular canned vegetables (not low-sodium or reduced-sodium). Regular canned tomato sauce and paste (not low-sodium or reduced-sodium). Regular tomato and vegetable juice (not low-sodium or reduced-sodium). Pickles. Olives. Fruits Canned fruit in a light or heavy syrup. Fried fruit. Fruit in cream or butter sauce. Meat and other protein foods Fatty cuts of meat. Ribs. Fried meat. Bacon. Sausage. Bologna and other processed lunch meats. Salami. Fatback. Hotdogs. Bratwurst. Salted nuts and seeds. Canned beans with added salt. Canned or smoked fish. Whole eggs or egg yolks. Chicken or turkey with skin. Dairy Whole or 2% milk, cream, and half-and-half. Whole or full-fat cream cheese. Whole-fat or sweetened yogurt. Full-fat cheese. Nondairy creamers. Whipped toppings.  Processed cheese and cheese spreads. Fats and oils Butter. Stick margarine. Lard. Shortening. Ghee. Bacon fat. Tropical oils, such as coconut, palm kernel, or palm oil. Seasoning and other foods Salted popcorn and pretzels. Onion salt, garlic salt, seasoned salt, table salt, and sea salt. Worcestershire sauce. Tartar sauce. Barbecue sauce. Teriyaki sauce. Soy sauce, including reduced-sodium. Steak sauce. Canned and packaged gravies. Fish sauce. Oyster sauce. Cocktail sauce. Horseradish that you find on the shelf. Ketchup. Mustard. Meat flavorings and tenderizers. Bouillon cubes. Hot sauce and Tabasco sauce. Premade or packaged marinades. Premade or packaged taco seasonings. Relishes. Regular salad dressings. Where to find more information:  National Heart, Lung, and Blood Institute: www.nhlbi.nih.gov  American Heart Association: www.heart.org Summary  The DASH eating plan is a healthy eating plan that has been shown to reduce high blood pressure (hypertension). It may also reduce your risk for type 2 diabetes, heart disease, and stroke.  With the DASH eating plan, you should limit salt (sodium) intake to 2,300 mg a day. If you have hypertension, you may need to reduce your sodium intake to 1,500 mg a day.  When on the DASH eating plan, aim to eat more fresh fruits and vegetables, whole grains, lean proteins, low-fat dairy, and heart-healthy fats.  Work with your health care provider or diet and nutrition specialist (dietitian) to adjust your eating plan to your   individual calorie needs. This information is not intended to replace advice given to you by your health care provider. Make sure you discuss any questions you have with your health care provider. Document Released: 02/07/2011 Document Revised: 02/12/2016 Document Reviewed: 02/12/2016 Elsevier Interactive Patient Education  2018 Elsevier Inc.  Food Choices for Gastroesophageal Reflux Disease, Adult When you have gastroesophageal  reflux disease (GERD), the foods you eat and your eating habits are very important. Choosing the right foods can help ease your discomfort. What guidelines do I need to follow?  Choose fruits, vegetables, whole grains, and low-fat dairy products.  Choose low-fat meat, fish, and poultry.  Limit fats such as oils, salad dressings, butter, nuts, and avocado.  Keep a food diary. This helps you identify foods that cause symptoms.  Avoid foods that cause symptoms. These may be different for everyone.  Eat small meals often instead of 3 large meals a day.  Eat your meals slowly, in a place where you are relaxed.  Limit fried foods.  Cook foods using methods other than frying.  Avoid drinking alcohol.  Avoid drinking large amounts of liquids with your meals.  Avoid bending over or lying down until 2-3 hours after eating. What foods are not recommended? These are some foods and drinks that may make your symptoms worse: Vegetables Tomatoes. Tomato juice. Tomato and spaghetti sauce. Chili peppers. Onion and garlic. Horseradish. Fruits Oranges, grapefruit, and lemon (fruit and juice). Meats High-fat meats, fish, and poultry. This includes hot dogs, ribs, ham, sausage, salami, and bacon. Dairy Whole milk and chocolate milk. Sour cream. Cream. Butter. Ice cream. Cream cheese. Drinks Coffee and tea. Bubbly (carbonated) drinks or energy drinks. Condiments Hot sauce. Barbecue sauce. Sweets/Desserts Chocolate and cocoa. Donuts. Peppermint and spearmint. Fats and Oils High-fat foods. This includes French fries and potato chips. Other Vinegar. Strong spices. This includes black pepper, white pepper, red pepper, cayenne, curry powder, cloves, ginger, and chili powder. The items listed above may not be a complete list of foods and drinks to avoid. Contact your dietitian for more information. This information is not intended to replace advice given to you by your health care provider. Make  sure you discuss any questions you have with your health care provider. Document Released: 08/20/2011 Document Revised: 07/27/2015 Document Reviewed: 12/23/2012 Elsevier Interactive Patient Education  2017 Elsevier Inc.  

## 2017-06-02 NOTE — Progress Notes (Signed)
Assessment & Plan:  Erik Clay was seen today for establish care and referral.  Diagnoses and all orders for this visit:  Essential hypertension -     amLODipine (NORVASC) 10 MG tablet; Take 1 tablet (10 mg total) by mouth daily. Continue all antihypertensives as prescribed.  Remember to bring in your blood pressure log with you for your follow up appointment.  DASH/Mediterranean Diets are healthier choices for HTN.   Colon cancer screening -     Ambulatory referral to Gastroenterology  Gastroesophageal reflux disease, esophagitis presence not specified -     omeprazole (PRILOSEC) 20 MG capsule; Take 1 capsule (20 mg total) by mouth daily. INSTRUCTIONS: Avoid GERD Triggers: acidic, spicy or fried foods, caffeine, coffee, sodas,  alcohol and chocolate.    Tobacco dependence -     nicotine (NICODERM CQ - DOSED IN MG/24 HOURS) 14 mg/24hr patch; Place 1 patch (14 mg total) onto the skin daily. -     nicotine (NICODERM CQ - DOSED IN MG/24 HR) 7 mg/24hr patch; Place 1 patch (7 mg total) onto the skin daily. 1. Erik Clay continues to smoke 8 cigarettes per day. 2. Erik Clay was counseled on the dangers of tobacco use, and was advised to quit. We reviewed specific strategies to maximize success, including removing cigarettes and smoking materials from environment, stress management and support of family/friends as well as pharmacological alternatives. 3. A total of 5 minutes was spent on counseling for smoking cessation and Erik Clay is ready to quit and has chosen Nicotine patches to start today.  4. Erik Clay was offered Wellbutrin, Chantix,  Nicotine gum or lozenges.  Due to out of pocket costs Erik Clay was also given smoking cessation support and advised to contact: the Smoking Cessation hotline: 1-800-QUIT-NOW.  Erik Clay was also informed of our Smoking cessation classes which are also available through [facility] by calling [phone] or visit our website at  [email or web address].  5. Will follow up at next scheduled office visit.    Patient has been counseled on age-appropriate routine health concerns for screening and prevention. These are reviewed and up-to-date. Referrals have been placed accordingly. Immunizations are up-to-date or declined.    Subjective:   Chief Complaint  Patient presents with  . Establish Care    Pt. is here to estabish care for hypertension.   . Referral    Pt. request if he can have a dental referral.    HPI Erik Clay 57 y.o. male presents to office today to establish care. He has a history of HTN and GERD.   Essential Hypertension Chronic. Endorses medication compliance. Blood pressure is usually well controlled outside of today. He does continue to smoke and states he smoked a cigarette and was rushing prior to his appointment today.  Denies chest pain, shortness of breath, palpitations, lightheadedness, dizziness, headaches or BLE edema. He continues to smoke. BP Readings from Last 3 Encounters:  06/02/17 (!) 156/99  03/03/17 122/81  02/06/17 130/90   Tobacco Dependence Only smoking about 7 cigarettes a day. Ready to quit.   GERD Symptoms controlled with omeprazole.   Review of Systems  Constitutional: Negative for fever, malaise/fatigue and weight loss.  HENT: Negative.  Negative for nosebleeds.   Eyes: Negative.  Negative for blurred vision, double vision and photophobia.  Respiratory: Negative.  Negative for cough and shortness of breath.   Cardiovascular: Negative.  Negative for chest pain, palpitations and leg swelling.  Gastrointestinal: Positive for heartburn. Negative for nausea and vomiting.  Musculoskeletal:  Negative.  Negative for myalgias.  Neurological: Negative.  Negative for dizziness, focal weakness, seizures and headaches.  Psychiatric/Behavioral: Negative.  Negative for suicidal ideas.    Past Medical History:  Diagnosis Date  . GERD (gastroesophageal reflux disease)   .  Hypertension     Past Surgical History:  Procedure Laterality Date  . NO PAST SURGERIES      Family History  Problem Relation Age of Onset  . Diabetes Mother   . Hypertension Mother   . Hypertension Father   . Diabetes Sister   . Hypertension Sister   . Hypertension Brother   . Hypertension Sister     Social History Reviewed with no changes to be made today.   Outpatient Medications Prior to Visit  Medication Sig Dispense Refill  . Esomeprazole Magnesium (NEXIUM PO) Take by mouth.    Marland Kitchen amLODipine (NORVASC) 10 MG tablet Take 1 tablet (10 mg total) by mouth daily. 30 tablet 0  . omeprazole (PRILOSEC) 20 MG capsule Take 1 capsule (20 mg total) by mouth daily. 30 capsule 2   No facility-administered medications prior to visit.     No Known Allergies     Objective:    BP (!) 156/99 (BP Location: Right Arm, Patient Position: Sitting, Cuff Size: Small)   Pulse 88   Temp 98.2 F (36.8 C) (Oral)   Ht 5\' 8"  (1.727 m)   Wt 111 lb 3.2 oz (50.4 kg)   SpO2 94%   BMI 16.91 kg/m  Wt Readings from Last 3 Encounters:  06/02/17 111 lb 3.2 oz (50.4 kg)  03/03/17 113 lb (51.3 kg)  02/06/17 115 lb (52.2 kg)    Physical Exam  Constitutional: He is oriented to person, place, and time. He appears well-developed and well-nourished. He is cooperative.  HENT:  Head: Normocephalic and atraumatic.  Mouth/Throat: Abnormal dentition. Dental caries present.  Multiple missing teeth with very poor dentition  Eyes: EOM are normal.  Neck: Normal range of motion.  Cardiovascular: Normal rate, regular rhythm and normal heart sounds. Exam reveals no gallop and no friction rub.  No murmur heard. Pulmonary/Chest: Effort normal and breath sounds normal. No tachypnea. No respiratory distress. He has no decreased breath sounds. He has no wheezes. He has no rhonchi. He has no rales. He exhibits no tenderness.  Abdominal: Soft. Bowel sounds are normal.  Musculoskeletal: Normal range of motion. He  exhibits no edema.  Neurological: He is alert and oriented to person, place, and time. Coordination normal.  Skin: Skin is warm and dry.  Psychiatric: He has a normal mood and affect. His behavior is normal. Judgment and thought content normal.  Nursing note and vitals reviewed.      Patient has been counseled extensively about nutrition and exercise as well as the importance of adherence with medications and regular follow-up. The patient was given clear instructions to go to ER or return to medical center if symptoms don't improve, worsen or new problems develop. The patient verbalized understanding.   Follow-up: Return in about 3 months (around 09/01/2017) for HTN.   Claiborne Rigg, FNP-BC [facility] [place], [place] [phone]   06/02/2017, 11:38 AM

## 2017-07-02 ENCOUNTER — Encounter: Payer: Self-pay | Admitting: Nurse Practitioner

## 2017-07-24 MED FILL — OMEPRAZOLE DR 20 MG CAPSULE: 20 | 30 days supply | Qty: 30 | Fill #0

## 2017-07-24 MED FILL — AMLODIPINE BESYLATE 10 MG T: 10 | 30 days supply | Qty: 30 | Fill #0

## 2017-08-29 MED FILL — AMLODIPINE BESYLATE 10 MG T: 10 | 30 days supply | Qty: 30 | Fill #1

## 2017-08-29 MED FILL — OMEPRAZOLE 20 MG CAP: 20 | 30 days supply | Qty: 30 | Fill #1

## 2017-09-01 ENCOUNTER — Encounter: Payer: Self-pay | Admitting: Nurse Practitioner

## 2017-09-01 ENCOUNTER — Ambulatory Visit: Payer: PRIVATE HEALTH INSURANCE | Attending: Nurse Practitioner | Admitting: Nurse Practitioner

## 2017-09-01 VITALS — BP 148/99 | HR 79 | Temp 98.7°F | Ht 68.0 in | Wt 108.0 lb

## 2017-09-01 DIAGNOSIS — K219 Gastro-esophageal reflux disease without esophagitis: Secondary | ICD-10-CM

## 2017-09-01 DIAGNOSIS — Z1211 Encounter for screening for malignant neoplasm of colon: Secondary | ICD-10-CM | POA: Diagnosis not present

## 2017-09-01 DIAGNOSIS — F17200 Nicotine dependence, unspecified, uncomplicated: Secondary | ICD-10-CM | POA: Diagnosis not present

## 2017-09-01 DIAGNOSIS — I10 Essential (primary) hypertension: Secondary | ICD-10-CM | POA: Diagnosis present

## 2017-09-01 DIAGNOSIS — R634 Abnormal weight loss: Secondary | ICD-10-CM | POA: Diagnosis not present

## 2017-09-01 DIAGNOSIS — Z833 Family history of diabetes mellitus: Secondary | ICD-10-CM | POA: Diagnosis not present

## 2017-09-01 DIAGNOSIS — Z0000 Encounter for general adult medical examination without abnormal findings: Secondary | ICD-10-CM | POA: Diagnosis not present

## 2017-09-01 DIAGNOSIS — Z79899 Other long term (current) drug therapy: Secondary | ICD-10-CM | POA: Diagnosis not present

## 2017-09-01 DIAGNOSIS — Z8249 Family history of ischemic heart disease and other diseases of the circulatory system: Secondary | ICD-10-CM | POA: Diagnosis not present

## 2017-09-01 DIAGNOSIS — F17210 Nicotine dependence, cigarettes, uncomplicated: Secondary | ICD-10-CM | POA: Diagnosis not present

## 2017-09-01 MED ORDER — AMLODIPINE BESYLATE 10 MG PO TABS
10.0000 mg | ORAL_TABLET | Freq: Every day | ORAL | 1 refills | Status: DC
Start: 2017-09-01 — End: 2018-02-18

## 2017-09-01 MED ORDER — LOSARTAN POTASSIUM 50 MG PO TABS: 50.0000 mg | ORAL_TABLET | Freq: Every day | ORAL | 3 refills | Status: DC

## 2017-09-01 MED ORDER — NICOTINE 14 MG/24HR TD PT24: 14.0000 mg | MEDICATED_PATCH | Freq: Every day | TRANSDERMAL | 0 refills | Status: AC

## 2017-09-01 MED ORDER — NICOTINE 7 MG/24HR TD PT24: 7.0000 mg | MEDICATED_PATCH | Freq: Every day | TRANSDERMAL | 0 refills | Status: DC

## 2017-09-01 NOTE — Progress Notes (Signed)
Assessment & Plan:  Erik Clay was seen today for follow-up.  Diagnoses and all orders for this visit:  Essential hypertension -     amLODipine (NORVASC) 10 MG tablet; Take 1 tablet (10 mg total) by mouth daily. -     losartan (COZAAR) 50 MG tablet; Take 1 tablet (50 mg total) by mouth daily. Continue all antihypertensives as prescribed.  Remember to bring in your blood pressure log with you for your follow up appointment.  DASH/Mediterranean Diets are healthier choices for HTN.    Gastroesophageal reflux disease, esophagitis presence not specified Continue omeprazole as prescribed 20mg .  INSTRUCTIONS: Avoid GERD Triggers: acidic, spicy or fried foods, caffeine, coffee, sodas,  alcohol and chocolate.   Tobacco dependence He did not pick up his nicotine patch -     nicotine (NICODERM CQ - DOSED IN MG/24 HOURS) 14 mg/24hr patch; Place 1 patch (14 mg total) onto the skin daily. -     nicotine (NICODERM CQ - DOSED IN MG/24 HR) 7 mg/24hr patch; Place 1 patch (7 mg total) onto the skin daily. Take after you complete the 14 mg patches 1. Erik Clay continues to smoke 1/2 ppd cigarettes per day. 2. Erik Clay was counseled on the dangers of tobacco use, and was advised to quit. We reviewed specific strategies to maximize success, including removing cigarettes and smoking materials from environment, stress management and support of family/friends as well as pharmacological alternatives. 3. A total of 5 minutes was spent on counseling for smoking cessation and Erik Clay is ready to quit and has chosen Nicotine Patches to start today.  4. Erik Clay was offered Wellbutrin, Chantix, Nicotine patch, Nicotine gum or lozenges.  Due to out of pocket costs Erik Clay was also given smoking cessation support and advised to contact: the Smoking Cessation hotline: 1-800-QUIT-NOW.  Erik Clay was also informed of our Smoking cessation classes which are also available through [facility] and [facility] by calling  [phone] or visit our website at [email or web address].  5. Will follow up at next scheduled office visit.    Colon cancer screening -     Ambulatory referral to Gastroenterology    Patient has been counseled on age-appropriate routine health concerns for screening and prevention. These are reviewed and up-to-date. Referrals have been placed accordingly. Immunizations are up-to-date or declined.    Subjective:   Chief Complaint  Patient presents with  . Follow-up    Pt. is here for hypertension follow-up. Pt. stated he tihnk the amlodipine is not working well.    HPI Erik Clay 57 y.o. male presents to office today to establish care. He has a history of HTN and GERD.   CHRONIC HYPERTENSION Disease Monitoring  Blood pressure range: He checks his blood pressure at the kiosks at the local pharmacies with blood pressure averaging 140-150/80-90s. WILL ADD LOSARTAN TODAY Denies chest pain, shortness of breath, palpitations, lightheadedness, dizziness, headaches or BLE edema.  Taking Norvasc 10mg  as prescribed.   Chest pain: no   Dyspnea: no   Claudication: no  Medication compliance: yes  Medication Side Effects  Lightheadedness: no   Urinary frequency: no   Edema: no   Impotence: no  Preventitive Healthcare:  Exercise: no   Diet Pattern: salt not added to cooking  Salt Restriction:  yes  BP Readings from Last 3 Encounters:  09/01/17 (!) 148/99  06/02/17 (!) 156/99  03/03/17 122/81    GERD Paitent complains of heartburn. This has been associated with heartburn.  He denies belching and eructation, bilious reflux,  chest pain, choking on food, deep pressure at base of neck and difficulty swallowing. Symptoms have been present for several years. He denies dysphagia.  His weight is stable. He denies melena, hematochezia, hematemesis, and coffee ground emesis. Medical therapy in the past has included proton pump inhibitors.   Review of Systems  Constitutional: Negative for  fever, malaise/fatigue and weight loss.  HENT: Negative.  Negative for nosebleeds.   Eyes: Negative.  Negative for blurred vision, double vision and photophobia.  Respiratory: Positive for cough (chronic cough) and sputum production. Negative for shortness of breath.   Cardiovascular: Negative.  Negative for chest pain, palpitations and leg swelling.  Gastrointestinal: Positive for heartburn. Negative for nausea and vomiting.  Musculoskeletal: Negative.  Negative for myalgias.  Neurological: Negative.  Negative for dizziness, focal weakness, seizures and headaches.  Psychiatric/Behavioral: Negative.  Negative for suicidal ideas.    Past Medical History:  Diagnosis Date  . GERD (gastroesophageal reflux disease)   . Hypertension     Past Surgical History:  Procedure Laterality Date  . NO PAST SURGERIES      Family History  Problem Relation Age of Onset  . Diabetes Mother   . Hypertension Mother   . Hypertension Father   . Diabetes Sister   . Hypertension Sister   . Hypertension Brother   . Hypertension Sister     Social History Reviewed with no changes to be made today.   Outpatient Medications Prior to Visit  Medication Sig Dispense Refill  . Esomeprazole Magnesium (NEXIUM PO) Take by mouth.    Marland Kitchen omeprazole (PRILOSEC) 20 MG capsule Take 1 capsule (20 mg total) by mouth daily. 90 capsule 1  . amLODipine (NORVASC) 10 MG tablet Take 1 tablet (10 mg total) by mouth daily. 90 tablet 1  . nicotine (NICODERM CQ - DOSED IN MG/24 HOURS) 14 mg/24hr patch Place 1 patch (14 mg total) onto the skin daily. (Patient not taking: Reported on 09/01/2017) 36 patch 0  . nicotine (NICODERM CQ - DOSED IN MG/24 HR) 7 mg/24hr patch Place 1 patch (7 mg total) onto the skin daily. (Patient not taking: Reported on 09/01/2017) 28 patch 0   No facility-administered medications prior to visit.     No Known Allergies     Objective:    BP (!) 148/99 (BP Location: Left Arm, Patient Position: Sitting,  Cuff Size: Normal)   Pulse 79   Temp 98.7 F (37.1 C) (Oral)   Ht 5\' 8"  (1.727 m)   Wt 108 lb (49 kg)   SpO2 95%   BMI 16.42 kg/m  Wt Readings from Last 3 Encounters:  09/01/17 108 lb (49 kg)  06/02/17 111 lb 3.2 oz (50.4 kg)  03/03/17 113 lb (51.3 kg)    Physical Exam  Constitutional: He is oriented to person, place, and time. He appears well-developed and well-nourished. He is cooperative.  HENT:  Head: Normocephalic and atraumatic.  Eyes: EOM are normal.  Neck: Normal range of motion.  Cardiovascular: Normal rate, regular rhythm, normal heart sounds and intact distal pulses. Exam reveals no gallop and no friction rub.  No murmur heard. Pulmonary/Chest: Effort normal. No tachypnea. No respiratory distress. He has no decreased breath sounds. He has no wheezes. He has rhonchi in the right upper field, the right middle field, the right lower field, the left upper field, the left middle field and the left lower field. He has no rales. He exhibits no tenderness.  Scattered rhonchi  Abdominal: Soft. Bowel sounds are normal.  Musculoskeletal: Normal range of motion. He exhibits no edema.  Neurological: He is alert and oriented to person, place, and time. Coordination normal.  Skin: Skin is warm and dry.  Psychiatric: He has a normal mood and affect. His behavior is normal. Judgment and thought content normal.  Nursing note and vitals reviewed.     Patient has been counseled extensively about nutrition and exercise as well as the importance of adherence with medications and regular follow-up. The patient was given clear instructions to go to ER or return to medical center if symptoms don't improve, worsen or new problems develop. The patient verbalized understanding.   Follow-up: Return in about 1 month (around 09/29/2017) for BP recheck.   Claiborne RiggZelda W Jacyln Carmer, FNP-BC [facility] [place], [place] [phone]   09/01/2017, 2:07 PM

## 2017-09-01 NOTE — Patient Instructions (Addendum)
Hypertension Hypertension is another name for high blood pressure. High blood pressure forces your heart to work harder to pump blood. This can cause problems over time. There are two numbers in a blood pressure reading. There is a top number (systolic) over a bottom number (diastolic). It is best to have a blood pressure below 120/80. Healthy choices can help lower your blood pressure. You may need medicine to help lower your blood pressure if:  Your blood pressure cannot be lowered with healthy choices.  Your blood pressure is higher than 130/80.  Follow these instructions at home: Eating and drinking  If directed, follow the DASH eating plan. This diet includes: ? Filling half of your plate at each meal with fruits and vegetables. ? Filling one quarter of your plate at each meal with whole grains. Whole grains include whole wheat pasta, brown rice, and whole grain bread. ? Eating or drinking low-fat dairy products, such as skim milk or low-fat yogurt. ? Filling one quarter of your plate at each meal with low-fat (lean) proteins. Low-fat proteins include fish, skinless chicken, eggs, beans, and tofu. ? Avoiding fatty meat, cured and processed meat, or chicken with skin. ? Avoiding premade or processed food.  Eat less than 1,500 mg of salt (sodium) a day.  Limit alcohol use to no more than 1 drink a day for nonpregnant women and 2 drinks a day for men. One drink equals 12 oz of beer, 5 oz of wine, or 1 oz of hard liquor. Lifestyle  Work with your doctor to stay at a healthy weight or to lose weight. Ask your doctor what the best weight is for you.  Get at least 30 minutes of exercise that causes your heart to beat faster (aerobic exercise) most days of the week. This may include walking, swimming, or biking.  Get at least 30 minutes of exercise that strengthens your muscles (resistance exercise) at least 3 days a week. This may include lifting weights or pilates.  Do not use any  products that contain nicotine or tobacco. This includes cigarettes and e-cigarettes. If you need help quitting, ask your doctor.  Check your blood pressure at home as told by your doctor.  Keep all follow-up visits as told by your doctor. This is important. Medicines  Take over-the-counter and prescription medicines only as told by your doctor. Follow directions carefully.  Do not skip doses of blood pressure medicine. The medicine does not work as well if you skip doses. Skipping doses also puts you at risk for problems.  Ask your doctor about side effects or reactions to medicines that you should watch for. Contact a doctor if:  You think you are having a reaction to the medicine you are taking.  You have headaches that keep coming back (recurring).  You feel dizzy.  You have swelling in your ankles.  You have trouble with your vision. Get help right away if:  You get a very bad headache.  You start to feel confused.  You feel weak or numb.  You feel faint.  You get very bad pain in your: ? Chest. ? Belly (abdomen).  You throw up (vomit) more than once.  You have trouble breathing. Summary  Hypertension is another name for high blood pressure.  Making healthy choices can help lower blood pressure. If your blood pressure cannot be controlled with healthy choices, you may need to take medicine. This information is not intended to replace advice given to you by your health care   provider. Make sure you discuss any questions you have with your health care provider. Document Released: 08/07/2007 Document Revised: 01/17/2016 Document Reviewed: 01/17/2016 Elsevier Interactive Patient Education  2018 Elsevier Inc.  Cough, Adult A cough helps to clear your throat and lungs. A cough may last only 2-3 weeks (acute), or it may last longer than 8 weeks (chronic). Many different things can cause a cough. A cough may be a sign of an illness or another medical condition. Follow  these instructions at home:  Pay attention to any changes in your cough.  Take medicines only as told by your doctor. ? If you were prescribed an antibiotic medicine, take it as told by your doctor. Do not stop taking it even if you start to feel better. ? Talk with your doctor before you try using a cough medicine.  Drink enough fluid to keep your pee (urine) clear or pale yellow.  If the air is dry, use a cold steam vaporizer or humidifier in your home.  Stay away from things that make you cough at work or at home.  If your cough is worse at night, try using extra pillows to raise your head up higher while you sleep.  Do not smoke, and try not to be around smoke. If you need help quitting, ask your doctor.  Do not have caffeine.  Do not drink alcohol.  Rest as needed. Contact a doctor if:  You have new problems (symptoms).  You cough up yellow fluid (pus).  Your cough does not get better after 2-3 weeks, or your cough gets worse.  Medicine does not help your cough and you are not sleeping well.  You have pain that gets worse or pain that is not helped with medicine.  You have a fever.  You are losing weight and you do not know why.  You have night sweats. Get help right away if:  You cough up blood.  You have trouble breathing.  Your heartbeat is very fast. This information is not intended to replace advice given to you by your health care provider. Make sure you discuss any questions you have with your health care provider. Document Released: 11/01/2010 Document Revised: 07/27/2015 Document Reviewed: 04/27/2014 Elsevier Interactive Patient Education  [employer]2018 Elsevier Inc.

## 2017-09-02 LAB — HEPATITIS C ANTIBODY

## 2017-09-02 LAB — BASIC METABOLIC PANEL
BUN/Creatinine Ratio: 10 (ref 9–20)
BUN: 9 mg/dL (ref 6–24)
CO2: 25 mmol/L (ref 20–29)
CREATININE: 0.94 mg/dL (ref 0.76–1.27)
Calcium: 9.4 mg/dL (ref 8.7–10.2)
Chloride: 99 mmol/L (ref 96–106)
GFR calc Af Amer: 106 mL/min/{1.73_m2} (ref >59)
GFR, EST NON AFRICAN AMERICAN: 92 mL/min/{1.73_m2} (ref >59)
GLUCOSE: 97 mg/dL (ref 65–99)
Potassium: 4.4 mmol/L (ref 3.5–5.2)
SODIUM: 141 mmol/L (ref 134–144)

## 2017-09-02 LAB — CBC
HEMATOCRIT: 42.1 % (ref 37.5–51.0)
HEMOGLOBIN: 14.3 g/dL (ref 13.0–17.7)
MCH: 33 pg (ref 26.6–33.0)
MCHC: 34 g/dL (ref 31.5–35.7)
MCV: 97 fL (ref 79–97)
Platelets: 311 10*3/uL (ref 150–450)
RBC: 4.33 x10E6/uL (ref 4.14–5.80)
RDW: 13.8 % (ref 12.3–15.4)
WBC: 9.2 10*3/uL (ref 3.4–10.8)

## 2017-09-02 LAB — TSH: TSH: 2.3 u[IU]/mL (ref 0.450–4.500)

## 2017-09-05 ENCOUNTER — Telehealth: Payer: Self-pay

## 2017-09-05 NOTE — Telephone Encounter (Signed)
-----   Message from Claiborne RiggZelda W Fleming, NP sent at 09/03/2017  9:31 AM EDT ----- Labs are all normal. Kidney function is good. There is no anemia.  Thyroid is normal as well.Make sure you are drinking at least 48 oz of water per day. Work on eating a low fat, heart healthy diet and participate in regular aerobic exercise program to control as well. Exercise at least  30 minutes per day-5 days per week. Avoid red meat. No fried foods. No junk foods, sodas, sugary foods or drinks, unhealthy snacking, alcohol or smoking.

## 2017-09-05 NOTE — Telephone Encounter (Signed)
CMA spoke to patient to inform lab results.  Pt. Understood.

## 2017-10-03 ENCOUNTER — Encounter: Payer: Self-pay | Admitting: Nurse Practitioner

## 2017-10-03 ENCOUNTER — Ambulatory Visit: Payer: PRIVATE HEALTH INSURANCE | Admitting: Nurse Practitioner

## 2017-10-06 ENCOUNTER — Ambulatory Visit: Payer: PRIVATE HEALTH INSURANCE | Admitting: Pharmacist

## 2017-10-13 MED FILL — OMEPRAZOLE 20 MG CAP: 20 | 30 days supply | Qty: 30 | Fill #2

## 2017-10-13 MED FILL — LOSARTAN POTASSIUM 50 MG TA: 50 | 90 days supply | Qty: 90 | Fill #0

## 2017-10-13 MED FILL — AMLODIPINE BESYLATE 10 MG T: 10 | 30 days supply | Qty: 30 | Fill #2

## 2017-10-29 ENCOUNTER — Encounter: Payer: PRIVATE HEALTH INSURANCE | Admitting: Pharmacist

## 2017-10-30 ENCOUNTER — Encounter: Payer: PRIVATE HEALTH INSURANCE | Admitting: Pharmacist

## 2017-11-05 ENCOUNTER — Encounter: Payer: PRIVATE HEALTH INSURANCE | Admitting: Pharmacist

## 2017-11-05 NOTE — Progress Notes (Deleted)
   S:    PCP: [name]  Patient arrives ***.    Presents to the clinic for hypertension evaluation, counseling, and management. Patient was referred by Erik Clay on 09/01/17. BP at that visit 148/99. At that visit, he reported checking his blood pressure at the kiosks at the local pharmacies with blood pressure averaging 140-150/80-90s. Erik Clay added losartan 50 mg daily at that time.   Patient {Actions; denies-reports:120008} adherence with medications.  Current BP Medications include:   - losartan 50 mg daily - amlodipine 10 mg daily  Dietary habits include: *** Exercise habits include:*** Family / Social history: ***  ASCVD risk factors include:***  Home BP readings: ***  O:  L arm after 5 minutes rest ***, HR ***  Last 3 Office BP readings: BP Readings from Last 3 Encounters:  09/01/17 (!) 148/99  06/02/17 (!) 156/99  03/03/17 122/81    BMET    Component Value Date/Time   NA 141 09/01/2017 1413   K 4.4 09/01/2017 1413   CL 99 09/01/2017 1413   CO2 25 09/01/2017 1413   GLUCOSE 97 09/01/2017 1413   BUN 9 09/01/2017 1413   CREATININE 0.94 09/01/2017 1413   CALCIUM 9.4 09/01/2017 1413   GFRNONAA 92 09/01/2017 1413   GFRAA 106 09/01/2017 1413    Renal function: CrCl cannot be calculated (Patient's most recent lab result is older than the maximum 21 days allowed.).  A/P: Hypertension longstanding/newly diagnosed currently *** on current medications. BP Goal = *** mmHg. Patient {Is/is not:9024} adherent with current medications.  -{Meds adjust:18428} ***.  -F/u labs ordered - *** -Counseled on lifestyle modifications for blood pressure control including reduced dietary sodium, increased exercise, adequate sleep  Results reviewed and written information provided.   Total time in face-to-face counseling *** minutes.   F/U Clinic Visit in ***.  Patient seen with ***

## 2017-11-14 ENCOUNTER — Encounter: Payer: PRIVATE HEALTH INSURANCE | Admitting: Pharmacist

## 2017-11-20 MED FILL — AMLODIPINE BESYLATE 10 MG T: 10 | 30 days supply | Qty: 30 | Fill #3

## 2017-11-20 MED FILL — OMEPRAZOLE 20 MG CAP: 20 | 30 days supply | Qty: 30 | Fill #3

## 2018-01-13 MED FILL — AMLODIPINE BESYLATE 10 MG T: 10 | 30 days supply | Qty: 30 | Fill #4

## 2018-01-14 ENCOUNTER — Encounter: Payer: PRIVATE HEALTH INSURANCE | Admitting: Pharmacist

## 2018-01-27 MED FILL — OMEPRAZOLE 20 MG CAP: 20 | 30 days supply | Qty: 30 | Fill #4

## 2018-01-28 ENCOUNTER — Emergency Department (HOSPITAL_COMMUNITY)
Admission: EM | Admit: 2018-01-28 | Discharge: 2018-01-28 | Disposition: A | Discharge status: Home / Self Care | Payer: PRIVATE HEALTH INSURANCE | Attending: Emergency Medicine | Admitting: Emergency Medicine

## 2018-01-28 ENCOUNTER — Emergency Department (HOSPITAL_COMMUNITY): Payer: PRIVATE HEALTH INSURANCE | Admitting: Anesthesiology

## 2018-01-28 ENCOUNTER — Encounter (HOSPITAL_COMMUNITY)
Admission: EM | Disposition: A | Discharge status: Home / Self Care | Payer: Self-pay | Source: Home / Self Care | Attending: Emergency Medicine

## 2018-01-28 ENCOUNTER — Encounter (HOSPITAL_COMMUNITY): Payer: Self-pay | Admitting: Emergency Medicine

## 2018-01-28 ENCOUNTER — Other Ambulatory Visit: Payer: Self-pay

## 2018-01-28 ENCOUNTER — Emergency Department (HOSPITAL_COMMUNITY): Payer: PRIVATE HEALTH INSURANCE

## 2018-01-28 ENCOUNTER — Ambulatory Visit (HOSPITAL_COMMUNITY)
Admission: EM | Admit: 2018-01-28 | Discharge: 2018-01-28 | Disposition: A | Discharge status: Home / Self Care | Payer: PRIVATE HEALTH INSURANCE | Source: Home / Self Care | Attending: Family Medicine | Admitting: Family Medicine

## 2018-01-28 ENCOUNTER — Encounter (HOSPITAL_COMMUNITY): Payer: Self-pay

## 2018-01-28 DIAGNOSIS — K611 Rectal abscess: Secondary | ICD-10-CM | POA: Insufficient documentation

## 2018-01-28 DIAGNOSIS — I10 Essential (primary) hypertension: Secondary | ICD-10-CM | POA: Insufficient documentation

## 2018-01-28 DIAGNOSIS — I700 Atherosclerosis of aorta: Secondary | ICD-10-CM | POA: Insufficient documentation

## 2018-01-28 DIAGNOSIS — F17210 Nicotine dependence, cigarettes, uncomplicated: Secondary | ICD-10-CM | POA: Diagnosis not present

## 2018-01-28 DIAGNOSIS — Z79899 Other long term (current) drug therapy: Secondary | ICD-10-CM | POA: Insufficient documentation

## 2018-01-28 DIAGNOSIS — L0291 Cutaneous abscess, unspecified: Secondary | ICD-10-CM | POA: Diagnosis present

## 2018-01-28 DIAGNOSIS — K219 Gastro-esophageal reflux disease without esophagitis: Secondary | ICD-10-CM | POA: Diagnosis not present

## 2018-01-28 DIAGNOSIS — B954 Other streptococcus as the cause of diseases classified elsewhere: Secondary | ICD-10-CM | POA: Diagnosis not present

## 2018-01-28 HISTORY — PX: INCISION AND DRAINAGE PERIRECTAL ABSCESS: SHX1804

## 2018-01-28 LAB — BASIC METABOLIC PANEL
Anion gap: 12 (ref 5–15)
BUN: 18 mg/dL (ref 6–20)
CO2: 23 mmol/L (ref 22–32)
Calcium: 9.7 mg/dL (ref 8.9–10.3)
Chloride: 96 mmol/L — ABNORMAL LOW (ref 98–111)
Creatinine, Ser: 1.35 mg/dL — ABNORMAL HIGH (ref 0.61–1.24)
GFR calc Af Amer: >60 mL/min (ref >60)
GFR calc non Af Amer: 59 mL/min — ABNORMAL LOW (ref >60)
Glucose, Bld: 101 mg/dL — ABNORMAL HIGH (ref 70–99)
Potassium: 4.2 mmol/L (ref 3.5–5.1)
Sodium: 131 mmol/L — ABNORMAL LOW (ref 135–145)

## 2018-01-28 LAB — CBC WITH DIFFERENTIAL/PLATELET
Abs Immature Granulocytes: 0.07 10*3/uL (ref 0.00–0.07)
Basophils Absolute: 0 10*3/uL (ref 0.0–0.1)
Basophils Relative: 0 %
Eosinophils Absolute: 0 10*3/uL (ref 0.0–0.5)
Eosinophils Relative: 0 %
HCT: 39.4 % (ref 39.0–52.0)
Hemoglobin: 12.8 g/dL — ABNORMAL LOW (ref 13.0–17.0)
Immature Granulocytes: 0 %
Lymphocytes Relative: 3 %
Lymphs Abs: 0.5 10*3/uL — ABNORMAL LOW (ref 0.7–4.0)
MCH: 30.2 pg (ref 26.0–34.0)
MCHC: 32.5 g/dL (ref 30.0–36.0)
MCV: 92.9 fL (ref 80.0–100.0)
Monocytes Absolute: 1 10*3/uL (ref 0.1–1.0)
Monocytes Relative: 6 %
Neutro Abs: 14.3 10*3/uL — ABNORMAL HIGH (ref 1.7–7.7)
Neutrophils Relative %: 91 %
Platelets: 313 10*3/uL (ref 150–400)
RBC: 4.24 MIL/uL (ref 4.22–5.81)
RDW: 14.5 % (ref 11.5–15.5)
WBC: 16 10*3/uL — ABNORMAL HIGH (ref 4.0–10.5)
nRBC: 0 % (ref 0.0–0.2)

## 2018-01-28 SURGERY — INCISION AND DRAINAGE, ABSCESS, PERIRECTAL
Anesthesia: General | Site: Perineum

## 2018-01-28 MED ORDER — HYDRALAZINE HCL 20 MG/ML IJ SOLN
5.0000 mg | Freq: Once | INTRAMUSCULAR | Status: AC
  Administered 2018-01-28: 5 mg via INTRAVENOUS

## 2018-01-28 MED ORDER — DEXAMETHASONE SODIUM PHOSPHATE 10 MG/ML IJ SOLN
INTRAMUSCULAR | Status: DC | PRN
  Administered 2018-01-28: 10 mg via INTRAVENOUS

## 2018-01-28 MED ORDER — OXYCODONE HCL 5 MG/5ML PO SOLN: 5.0000 mg | Freq: Once | ORAL | Status: DC | PRN

## 2018-01-28 MED ORDER — FENTANYL CITRATE (PF) 250 MCG/5ML IJ SOLN
INTRAMUSCULAR | Status: DC | PRN
  Administered 2018-01-28: 100 ug via INTRAVENOUS
  Administered 2018-01-28 (×2): 50 ug via INTRAVENOUS

## 2018-01-28 MED ORDER — LIDOCAINE HCL (CARDIAC) PF 100 MG/5ML IV SOSY
PREFILLED_SYRINGE | INTRAVENOUS | Status: DC | PRN
  Administered 2018-01-28: 60 mg via INTRAVENOUS

## 2018-01-28 MED ORDER — SULFAMETHOXAZOLE-TRIMETHOPRIM 800-160 MG PO TABS: 1.0000 | ORAL_TABLET | Freq: Two times a day (BID) | ORAL | 0 refills | Status: AC

## 2018-01-28 MED ORDER — DEXAMETHASONE SODIUM PHOSPHATE 10 MG/ML IJ SOLN
INTRAMUSCULAR | Status: AC
  Filled 2018-01-28: qty 1

## 2018-01-28 MED ORDER — EPHEDRINE SULFATE 50 MG/ML IJ SOLN
INTRAMUSCULAR | Status: DC | PRN
  Administered 2018-01-28: 10 mg via INTRAVENOUS
  Administered 2018-01-28: 5 mg via INTRAVENOUS

## 2018-01-28 MED ORDER — IBUPROFEN 800 MG PO TABS: 800.0000 mg | ORAL_TABLET | Freq: Three times a day (TID) | ORAL | 0 refills | Status: DC | PRN

## 2018-01-28 MED ORDER — ROCURONIUM BROMIDE 50 MG/5ML IV SOSY
PREFILLED_SYRINGE | INTRAVENOUS | Status: AC
  Filled 2018-01-28: qty 10

## 2018-01-28 MED ORDER — SUCCINYLCHOLINE CHLORIDE 200 MG/10ML IV SOSY
PREFILLED_SYRINGE | INTRAVENOUS | Status: AC
  Filled 2018-01-28: qty 10

## 2018-01-28 MED ORDER — PROPOFOL 10 MG/ML IV BOLUS
INTRAVENOUS | Status: AC
  Filled 2018-01-28: qty 20

## 2018-01-28 MED ORDER — CEFTRIAXONE SODIUM 250 MG IJ SOLR: 250.0000 mg | Freq: Once | INTRAMUSCULAR | Status: DC

## 2018-01-28 MED ORDER — AZITHROMYCIN 250 MG PO TABS: 1000.0000 mg | ORAL_TABLET | Freq: Once | ORAL | Status: DC

## 2018-01-28 MED ORDER — LACTATED RINGERS IV SOLN
INTRAVENOUS | Status: DC
  Administered 2018-01-28: 16:00:00 via INTRAVENOUS

## 2018-01-28 MED ORDER — HYDROCODONE-ACETAMINOPHEN 5-325 MG PO TABS: 1.0000 | ORAL_TABLET | ORAL | 0 refills | Status: DC | PRN

## 2018-01-28 MED ORDER — MIDAZOLAM HCL 2 MG/2ML IJ SOLN
INTRAMUSCULAR | Status: AC
  Filled 2018-01-28: qty 2

## 2018-01-28 MED ORDER — SODIUM CHLORIDE 0.9 % IV SOLN: INTRAVENOUS | Status: DC

## 2018-01-28 MED ORDER — ONDANSETRON HCL 4 MG/2ML IJ SOLN
INTRAMUSCULAR | Status: AC
  Filled 2018-01-28: qty 2

## 2018-01-28 MED ORDER — HYDRALAZINE HCL 20 MG/ML IJ SOLN
INTRAMUSCULAR | Status: AC
  Filled 2018-01-28: qty 1

## 2018-01-28 MED ORDER — SULFAMETHOXAZOLE-TRIMETHOPRIM 800-160 MG PO TABS: 1.0000 | ORAL_TABLET | Freq: Two times a day (BID) | ORAL | 0 refills | Status: DC

## 2018-01-28 MED ORDER — PROPOFOL 10 MG/ML IV BOLUS
INTRAVENOUS | Status: DC | PRN
  Administered 2018-01-28: 150 mg via INTRAVENOUS

## 2018-01-28 MED ORDER — IOHEXOL 300 MG/ML  SOLN
100.0000 mL | Freq: Once | INTRAMUSCULAR | Status: AC | PRN
  Administered 2018-01-28: 100 mL via INTRAVENOUS

## 2018-01-28 MED ORDER — 0.9 % SODIUM CHLORIDE (POUR BTL) OPTIME
TOPICAL | Status: DC | PRN
  Administered 2018-01-28: 1000 mL

## 2018-01-28 MED ORDER — FENTANYL CITRATE (PF) 100 MCG/2ML IJ SOLN: 25.0000 ug | INTRAMUSCULAR | Status: DC | PRN

## 2018-01-28 MED ORDER — ONDANSETRON HCL 4 MG/2ML IJ SOLN
INTRAMUSCULAR | Status: DC | PRN
  Administered 2018-01-28: 4 mg via INTRAVENOUS

## 2018-01-28 MED ORDER — MIDAZOLAM HCL 5 MG/5ML IJ SOLN
INTRAMUSCULAR | Status: DC | PRN
  Administered 2018-01-28: 2 mg via INTRAVENOUS

## 2018-01-28 MED ORDER — LIDOCAINE 2% (20 MG/ML) 5 ML SYRINGE
INTRAMUSCULAR | Status: AC
  Filled 2018-01-28: qty 5

## 2018-01-28 MED ORDER — PIPERACILLIN-TAZOBACTAM 3.375 G IVPB 30 MIN
3.3750 g | Freq: Once | INTRAVENOUS | Status: AC
  Administered 2018-01-28: 3.375 g via INTRAVENOUS
  Filled 2018-01-28: qty 50

## 2018-01-28 MED ORDER — OXYCODONE HCL 5 MG PO TABS: 5.0000 mg | ORAL_TABLET | Freq: Once | ORAL | Status: DC | PRN

## 2018-01-28 MED ORDER — PROMETHAZINE HCL 25 MG/ML IJ SOLN: 6.2500 mg | INTRAMUSCULAR | Status: DC | PRN

## 2018-01-28 MED ORDER — FENTANYL CITRATE (PF) 250 MCG/5ML IJ SOLN
INTRAMUSCULAR | Status: AC
  Filled 2018-01-28: qty 5

## 2018-01-28 SURGICAL SUPPLY — 28 items
BNDG GAUZE ELAST 4 BULKY (GAUZE/BANDAGES/DRESSINGS) IMPLANT
COVER MAYO STAND STRL (DRAPES) ×3 IMPLANT
COVER SURGICAL LIGHT HANDLE (MISCELLANEOUS) ×3 IMPLANT
COVER WAND RF STERILE (DRAPES) ×1 IMPLANT
ELECT CAUTERY BLADE 6.4 (BLADE) ×3 IMPLANT
ELECT REM PT RETURN 9FT ADLT (ELECTROSURGICAL) ×3
ELECTRODE REM PT RTRN 9FT ADLT (ELECTROSURGICAL) ×1 IMPLANT
GAUZE SPONGE 4X4 12PLY STRL (GAUZE/BANDAGES/DRESSINGS) IMPLANT
GLOVE BIOGEL PI IND STRL 7.5 (GLOVE) ×1 IMPLANT
GLOVE BIOGEL PI INDICATOR 7.5 (GLOVE) ×2
GLOVE SURG SS PI 7.0 STRL IVOR (GLOVE) ×3 IMPLANT
GOWN STRL REUS W/ TWL LRG LVL3 (GOWN DISPOSABLE) ×2 IMPLANT
GOWN STRL REUS W/TWL LRG LVL3 (GOWN DISPOSABLE) ×6
KIT BASIN OR (CUSTOM PROCEDURE TRAY) ×3 IMPLANT
KIT TURNOVER KIT B (KITS) ×3 IMPLANT
NEEDLE 22X1 1/2 (OR ONLY) (NEEDLE) ×3 IMPLANT
NS IRRIG 1000ML POUR BTL (IV SOLUTION) ×3 IMPLANT
PACK LITHOTOMY IV (CUSTOM PROCEDURE TRAY) ×3 IMPLANT
PAD ARMBOARD 7.5X6 YLW CONV (MISCELLANEOUS) ×3 IMPLANT
PENCIL BUTTON HOLSTER BLD 10FT (ELECTRODE) ×3 IMPLANT
SPONGE LAP 18X18 X RAY DECT (DISPOSABLE) ×3 IMPLANT
SURGILUBE 2OZ TUBE FLIPTOP (MISCELLANEOUS) ×3 IMPLANT
SYR BULB 3OZ (MISCELLANEOUS) ×3 IMPLANT
TOWEL OR 17X24 6PK STRL BLUE (TOWEL DISPOSABLE) ×3 IMPLANT
TOWEL OR 17X26 10 PK STRL BLUE (TOWEL DISPOSABLE) ×3 IMPLANT
TUBE CONNECTING 12'X1/4 (SUCTIONS) ×1
TUBE CONNECTING 12X1/4 (SUCTIONS) ×2 IMPLANT
YANKAUER SUCT BULB TIP NO VENT (SUCTIONS) ×3 IMPLANT

## 2018-01-28 NOTE — Anesthesia Postprocedure Evaluation (Signed)
Anesthesia Post Note  Patient: Ysabel A Heid  Procedure(s) Performed: IRRIGATION AND DEBRIDEMENT PERIRECTAL ABSCESS (N/A Perineum)     Patient location during evaluation: PACU Anesthesia Type: General Level of consciousness: awake and alert Pain management: pain level controlled Vital Signs Assessment: post-procedure vital signs reviewed and stable Respiratory status: spontaneous breathing, nonlabored ventilation and respiratory function stable Cardiovascular status: blood pressure returned to baseline and stable Postop Assessment: no apparent nausea or vomiting Anesthetic complications: no    Last Vitals:  Vitals:   01/28/18 1735 01/28/18 1740  BP:    Pulse: 91 92  Resp: 13 13  Temp:    SpO2: 98% 95%    Last Pain:  Vitals:   01/28/18 1715  TempSrc:   PainSc: Asleep                 Eldar Robitaille,W. EDMOND

## 2018-01-28 NOTE — ED Notes (Signed)
Pt transported to radiology via stretcher 

## 2018-01-28 NOTE — Discharge Instructions (Signed)
Given pain extending to rectum with painful digital exam, please go to the emergency department for further evaluation needed.

## 2018-01-28 NOTE — Anesthesia Procedure Notes (Signed)
Date/Time: 01/28/2018 5:41 PM Performed by: Izola Priceockfield, Kenika Sahm Walton Jr., CRNA Pre-anesthesia Checklist: Patient identified, Emergency Drugs available, Suction available and Patient being monitored Patient Re-evaluated:Patient Re-evaluated prior to induction Oxygen Delivery Method: Circle system utilized Preoxygenation: Pre-oxygenation with 100% oxygen Induction Type: IV induction LMA: LMA inserted LMA Size: 4.0 Tube type: Oral Number of attempts: 1 Airway Equipment and Method: Stylet and Oral airway Placement Confirmation: ETT inserted through vocal cords under direct vision,  positive ETCO2 and breath sounds checked- equal and bilateral Tube secured with: Tape Dental Injury: Teeth and Oropharynx as per pre-operative assessment

## 2018-01-28 NOTE — ED Provider Notes (Signed)
[name]ENC[facility] Provider Note   CSN: [id number] Arrival date & time: 01/28/18  [id number]     History   Chief Complaint Chief Complaint  Patient presents with  . Abscess    HPI Erik Clay is a 57 y.o. male.  HPI   57 year old male presents today with complaints of abscess.  Patient notes that approximately 3 days ago developed swelling and pain to the left gluteus and perirectal region.  He notes significant discomfort, denies any discharge, bloody or purulent stools.  Patient denies any fever or abdominal pain.  He notes a history of abscess in the past.  No medications prior to arrival today.  Past Medical History:  Diagnosis Date  . GERD (gastroesophageal reflux disease)   . Hypertension     Patient Active Problem List   Diagnosis Date Noted  . Essential hypertension 12/08/2016  . Gastroesophageal reflux disease 12/08/2016    Past Surgical History:  Procedure Laterality Date  . NO PAST SURGERIES        Home Medications    Prior to Admission medications   Medication Sig Start Date End Date Taking? Authorizing Provider  amLODipine (NORVASC) 10 MG tablet Take 1 tablet (10 mg total) by mouth daily. 09/01/17   Claiborne RiggFleming, Zelda W, NP  Esomeprazole Magnesium (NEXIUM PO) Take by mouth.    [provider]  losartan (COZAAR) 50 MG tablet Take 1 tablet (50 mg total) by mouth daily. 09/01/17   Claiborne RiggFleming, Zelda W, NP  nicotine (NICODERM CQ - DOSED IN MG/24 HOURS) 14 mg/24hr patch Place 1 patch (14 mg total) onto the skin daily. 09/01/17   Claiborne RiggFleming, Zelda W, NP  nicotine (NICODERM CQ - DOSED IN MG/24 HR) 7 mg/24hr patch Place 1 patch (7 mg total) onto the skin daily. Take after you complete the 14 mg patches 09/01/17   Claiborne RiggFleming, Zelda W, NP  omeprazole (PRILOSEC) 20 MG capsule Take 1 capsule (20 mg total) by mouth daily. 06/02/17   Claiborne RiggFleming, Zelda W, NP    Family History Family History  Problem Relation Age of Onset  . Diabetes Mother   .  Hypertension Mother   . Hypertension Father   . Diabetes Sister   . Hypertension Sister   . Hypertension Brother   . Hypertension Sister     Social History Social History   Tobacco Use  . Smoking status: Current Every Day Smoker    Packs/day: 0.50    Types: Cigarettes  . Smokeless tobacco: Never Used  Substance Use Topics  . Alcohol use: Yes    Comment: 1-2 per day  . Drug use: No     Allergies   Patient has no known allergies.   Review of Systems Review of Systems  All other systems reviewed and are negative.   Physical Exam Updated Vital Signs BP (!) 154/106 (BP Location: Left Arm)   Pulse 84   Temp 98 F (36.7 C) (Oral)   Resp 16   Ht 5\' 9"  (1.753 m)   Wt 59 kg   SpO2 100%   BMI 19.20 kg/m   Physical Exam  Constitutional: He is oriented to person, place, and time. He appears well-developed and well-nourished.  HENT:  Head: Normocephalic and atraumatic.  Eyes: Pupils are equal, round, and reactive to light. Conjunctivae are normal. Right eye exhibits no discharge. Left eye exhibits no discharge. No scleral icterus.  Neck: Normal range of motion. No JVD present. No tracheal deviation present.  Pulmonary/Chest: Effort normal. No  stridor.  Musculoskeletal:  Large area of induration approximately 5 x 8 cm to the left gluteus extending up to the rectum and down to the perineum, no central fluctuance  Neurological: He is alert and oriented to person, place, and time. Coordination normal.  Psychiatric: He has a normal mood and affect. His behavior is normal. Judgment and thought content normal.  Nursing note and vitals reviewed.    ED Treatments / Results  Labs (all labs ordered are listed, but only abnormal results are displayed) Labs Reviewed  CBC WITH DIFFERENTIAL/PLATELET - Abnormal; Notable for the following components:      Result Value   WBC 16.0 (*)    Hemoglobin 12.8 (*)    Neutro Abs 14.3 (*)    Lymphs Abs 0.5 (*)    All other components  within normal limits  BASIC METABOLIC PANEL - Abnormal; Notable for the following components:   Sodium 131 (*)    Chloride 96 (*)    Glucose, Bld 101 (*)    Creatinine, Ser 1.35 (*)    GFR calc non Af Amer 59 (*)    All other components within normal limits    EKG None  Radiology Ct Pelvis W Contrast  Result Date: 01/28/2018 CLINICAL DATA:  Left buttocks abscess. EXAM: CT PELVIS WITH CONTRAST TECHNIQUE: Multidetector CT imaging of the pelvis was performed using the standard protocol following the bolus administration of intravenous contrast. CONTRAST:  OMNIPAQUE IOHEXOL 300 MG/ML  SOLN COMPARISON:  None. FINDINGS: Urinary Tract:  No abnormality visualized. Bowel:  Unremarkable visualized pelvic bowel loops. Vascular/Lymphatic: Atherosclerosis of abdominal aorta and iliac arteries is noted without aneurysm formation. No adenopathy is noted. Reproductive:  Prostate gland is unremarkable. Other: 9.0 x 4.8 x 4.5 cm irregular enhancing multiloculated fluid collection is seen in the left buttocks and perineal region concerning for abscess. Musculoskeletal: No significant abnormality is noted. IMPRESSION: 9.0 x 4.8 x 4.5 cm irregular enhancing multiloculated fluid collection is seen in the left buttocks region as well as in the left perineal region, consistent with multiloculated abscess. Aortic Atherosclerosis (ICD10-I70.0). Electronically Signed   By: Lupita Raider, M.D.   On: 01/28/2018 14:45    Procedures Procedures (including critical care time)  Medications Ordered in ED Medications  iohexol (OMNIPAQUE) 300 MG/ML solution 100 mL (100 mLs Intravenous Contrast Given 01/28/18 1415)     Initial Impression / Assessment and Plan / ED Course  I have reviewed the triage vital signs and the nursing notes.  Pertinent labs & imaging results that were available during my care of the patient were reviewed by me and considered in my medical decision making (see chart for details).      Labs:   Imaging:  Consults: General surgery  Therapeutics:  Discharge Meds:   Assessment/Plan: 57 year old male presents today with significant abscess to his left gluteus.  Patient has an elevated white count at 16 with 9 x 4.8 x 4.5 cm fluid collection in the left buttocks.  This extends up to the rectum and perineum.  I do not feel bedside I&D would be successful general surgery consulted he would see patient at bedside for likely for management.   Final Clinical Impressions(s) / ED Diagnoses   Final diagnoses:  Abscess    ED Discharge Orders    None       Rosalio Loud 01/28/18 1546    Mancel Bale, MD 01/28/18 2030

## 2018-01-28 NOTE — ED Triage Notes (Signed)
Pt. Stated, I have an abscess on my butt for about a week

## 2018-01-28 NOTE — Op Note (Signed)
Preoperative diagnosis: Perirectal abscess  Postoperative diagnosis: same   Procedure: incision and drainage of perirectal abscess  Surgeon: Feliciana RossettiLuke Kinsinger, M.D.  Asst: none  Anesthesia: General anesthesia  Indications for procedure: Erik LoganRichard A Clay is a 57 y.o. year old male with symptoms of perianal pain and swelling in the left buttock.  Description of procedure: The patient was brought into the operative suite. Anesthesia was administered with general anesthesia. WHO checklist was applied. The patient was then placed in lithotomy position. The area was prepped and draped in the usual sterile fashion.  Next, a stab incision was made over the area of maximum fluctuance. Large amount of purulent fluid drained out and cultures were taken. A counter incision was made about 4cm away and a 1/4" penrose was passed between the incisions and sutured together with 2-0 silk. The abscess was irrigated with saline. Bandage was put in place and the patient was taken to pacu in stable condition.  Findings: large perirectal abscess  Specimen: culture of abscess  Implant: 1/4" penrose drain in loop   Blood loss: <1630ml  Local anesthesia: none  Complications: none  Feliciana RossettiLuke Kinsinger, M.D. General, Bariatric, & Minimally Invasive Surgery [facility]

## 2018-01-28 NOTE — H&P (Signed)
[facility] Consult/Admission Note  Erik LoganRichard A Clay Mar 15, 1960  [id number].    Requesting MD: Burna FortsJeff Hedges, PA Chief Complaint/Reason for Consult: gluteal abscesss  HPI:   Pt is a 57 yo male with a hx of tobacco use for 40 years, half a pack a day, HTN who presented to the ED with buttock pain. Pt states he went to urgent care and was advised to come to the ER. Pt states he began having pain 3 days ago. Pain has progressively worsened to constant, worse with touch or sitting, non radiating with associated swelling. Pt tried a warm bath with no relief. No fever, chills or other associated symptoms. He last ate last night and does not take anticoagulation medication. He had an abscess on the r side about a year ago. Pt drinks ETOH and uses tobacco. He denies other drug use.   ED Course: WBC 16.0, Hgb 12.8, creatinine 1.35 CT Scan:  9.0 x 4.8 x 4.5 cm irregular enhancing multiloculated fluid collection is seen in the left buttocks region as well as in the left perineal region, consistent with multiloculated abscess.  ROS:  Review of Systems  Constitutional: Negative for chills, diaphoresis and fever.  HENT: Negative for sore throat.   Respiratory: Negative for cough and shortness of breath.   Cardiovascular: Negative for chest pain.  Gastrointestinal: Negative for abdominal pain, blood in stool, constipation, diarrhea, nausea and vomiting.  Genitourinary: Negative for dysuria.       + for L buttock pain and swelling  Skin: Negative for rash.  Neurological: Negative for dizziness and loss of consciousness.  All other systems reviewed and are negative.    Family History  Problem Relation Age of Onset  . Diabetes Mother   . Hypertension Mother   . Hypertension Father   . Diabetes Sister   . Hypertension Sister   . Hypertension Brother   . Hypertension Sister     Past Medical History:  Diagnosis Date  . GERD (gastroesophageal reflux disease)   . Hypertension      Past Surgical History:  Procedure Laterality Date  . NO PAST SURGERIES      Social History:  reports that he has been smoking cigarettes. He has been smoking about 0.50 packs per day. He has never used smokeless tobacco. He reports that he drinks alcohol. He reports that he does not use drugs.  Allergies: No Known Allergies   (Not in a hospital admission)  Blood pressure (!) 154/106, pulse 84, temperature 98 F (36.7 C), temperature source Oral, resp. rate 16, height 5\' 9"  (1.753 m), weight 59 kg, SpO2 100 %.  Physical Exam  Constitutional: He is oriented to person, place, and time. No distress.  Thin, frail AA male  HENT:  Head: Normocephalic and atraumatic.  Nose: Nose normal.  Mouth/Throat: Uvula is midline, oropharynx is clear and moist and mucous membranes are normal. Abnormal dentition (numerous teeth missing and broken). No oropharyngeal exudate.  Eyes: Lids are normal. Right eye exhibits no discharge. Left eye exhibits no discharge. Right conjunctiva has no hemorrhage. Left conjunctiva has no hemorrhage.  Pupils are equal and round  Neck: Normal range of motion. Neck supple. No thyromegaly present.  Cardiovascular: Normal rate, regular rhythm, normal heart sounds and intact distal pulses.  No murmur heard. Pulses:      Radial pulses are 2+ on the right side, and 2+ on the left side.       Dorsalis pedis pulses are 2+ on the right side, and  2+ on the left side.  Pulmonary/Chest: Effort normal and breath sounds normal. No respiratory distress. He has no wheezes. He has no rhonchi. He has no rales.  Abdominal: Soft. Bowel sounds are normal. He exhibits no distension. There is no hepatosplenomegaly. There is no tenderness. There is no rigidity and no guarding.  Genitourinary:  Genitourinary Comments: Large L gluteal abscess with TTP  Musculoskeletal: Normal range of motion. He exhibits no edema, tenderness or deformity.  Lymphadenopathy:    He has no cervical adenopathy.   Neurological: He is alert and oriented to person, place, and time.  Skin: Skin is warm and dry. He is not diaphoretic.  Psychiatric: He has a normal mood and affect.  Nursing note and vitals reviewed.   Results for orders placed or performed during the hospital encounter of 01/28/18 (from the past 48 hour(s))  CBC with Differential     Status: Abnormal   Collection Time: 01/28/18 11:58 AM  Result Value Ref Range   WBC 16.0 (H) 4.0 - 10.5 K/uL   RBC 4.24 4.22 - 5.81 MIL/uL   Hemoglobin 12.8 (L) 13.0 - 17.0 g/dL   HCT 69.6 29.5 - 28.4 %   MCV 92.9 80.0 - 100.0 fL   MCH 30.2 26.0 - 34.0 pg   MCHC 32.5 30.0 - 36.0 g/dL   RDW 13.2 44.0 - 10.2 %   Platelets 313 150 - 400 K/uL   nRBC 0.0 0.0 - 0.2 %   Neutrophils Relative % 91 %   Neutro Abs 14.3 (H) 1.7 - 7.7 K/uL   Lymphocytes Relative 3 %   Lymphs Abs 0.5 (L) 0.7 - 4.0 K/uL   Monocytes Relative 6 %   Monocytes Absolute 1.0 0.1 - 1.0 K/uL   Eosinophils Relative 0 %   Eosinophils Absolute 0.0 0.0 - 0.5 K/uL   Basophils Relative 0 %   Basophils Absolute 0.0 0.0 - 0.1 K/uL   Immature Granulocytes 0 %   Abs Immature Granulocytes 0.07 0.00 - 0.07 K/uL    Comment: Performed at [facility], [address], [place], [place] [address]  Basic metabolic panel     Status: Abnormal   Collection Time: 01/28/18 11:58 AM  Result Value Ref Range   Sodium 131 (L) 135 - 145 mmol/L   Potassium 4.2 3.5 - 5.1 mmol/L   Chloride 96 (L) 98 - 111 mmol/L   CO2 23 22 - 32 mmol/L   Glucose, Bld 101 (H) 70 - 99 mg/dL   BUN 18 6 - 20 mg/dL   Creatinine, Ser 6.44 (H) 0.61 - 1.24 mg/dL   Calcium 9.7 8.9 - 03.4 mg/dL   GFR calc non Af Amer 59 (L) >60 mL/min   GFR calc Af Amer >60 >60 mL/min   Anion gap 12 5 - 15    Comment: Performed at [facility], [address] [place], [place] [address]   Ct Pelvis W Contrast  Result Date: 01/28/2018 CLINICAL DATA:  Left buttocks abscess. EXAM: CT PELVIS WITH CONTRAST TECHNIQUE: Multidetector CT  imaging of the pelvis was performed using the standard protocol following the bolus administration of intravenous contrast. CONTRAST:  OMNIPAQUE IOHEXOL 300 MG/ML  SOLN COMPARISON:  None. FINDINGS: Urinary Tract:  No abnormality visualized. Bowel:  Unremarkable visualized pelvic bowel loops. Vascular/Lymphatic: Atherosclerosis of abdominal aorta and iliac arteries is noted without aneurysm formation. No adenopathy is noted. Reproductive:  Prostate gland is unremarkable. Other: 9.0 x 4.8 x 4.5 cm irregular enhancing multiloculated fluid  collection is seen in the left buttocks and perineal region concerning for abscess. Musculoskeletal: No significant abnormality is noted. IMPRESSION: 9.0 x 4.8 x 4.5 cm irregular enhancing multiloculated fluid collection is seen in the left buttocks region as well as in the left perineal region, consistent with multiloculated abscess. Aortic Atherosclerosis (ICD10-I70.0). Electronically Signed   By: Lupita Raider, M.D.   On: 01/28/2018 14:45      Assessment/Plan Active Problems:   * No active hospital problems. *  HTN Tobacco use ETOH  L gluteal abscess - OR today for drainage - pt wants to go home after surgery  FEN: NPO, IVF VTE: SCD's, holding lovenox in setting of surgery ID: Zosyn once then Augmentin Foley: none Follow up: [facility] office 2 weeks for wound check  Plan: OR today for incision and drainage, IVF, IV abx   Jerre Simon, [facility] 01/28/2018, 3:41 PM Pager: [phone] Consults: [phone] Mon-Fri 7:00 am-4:30 pm Sat-Sun 7:00 am-11:30 am

## 2018-01-28 NOTE — Anesthesia Preprocedure Evaluation (Addendum)
Anesthesia Evaluation  Patient identified by MRN, date of birth, ID band Patient awake    Reviewed: Allergy & Precautions, H&P , NPO status , Patient's Chart, lab work & pertinent test results  History of Anesthesia Complications Negative for: history of anesthetic complications  Airway Mallampati: II  TM Distance: >3 FB Neck ROM: Full    Dental no notable dental hx. (+) Partial Lower, Partial Upper, Dental Advisory Given   Pulmonary Current Smoker,    Pulmonary exam normal breath sounds clear to auscultation       Cardiovascular hypertension, Pt. on medications  Rhythm:Regular Rate:Normal     Neuro/Psych negative neurological ROS  negative psych ROS   GI/Hepatic Neg liver ROS, GERD  Medicated,  Endo/Other  negative endocrine ROS  Renal/GU Renal InsufficiencyRenal disease  negative genitourinary   Musculoskeletal negative musculoskeletal ROS (+)   Abdominal   Peds  Hematology negative hematology ROS (+)   Anesthesia Other Findings   Reproductive/Obstetrics negative OB ROS                           Anesthesia Physical Anesthesia Plan  ASA: II  Anesthesia Plan: General   Post-op Pain Management:    Induction: Intravenous  PONV Risk Score and Plan: 3 and Treatment may vary due to age or medical condition, Ondansetron, Dexamethasone and Midazolam  Airway Management Planned: LMA  Additional Equipment: None  Intra-op Plan:   Post-operative Plan: Extubation in OR  Informed Consent: I have reviewed the patients History and Physical, chart, labs and discussed the procedure including the risks, benefits and alternatives for the proposed anesthesia with the patient or authorized representative who has indicated his/her understanding and acceptance.   Dental advisory given  Plan Discussed with: CRNA and Anesthesiologist  Anesthesia Plan Comments:       Anesthesia Quick  Evaluation

## 2018-01-28 NOTE — ED Triage Notes (Signed)
Pt states he has a abscess on his buttocks x 2 days

## 2018-01-28 NOTE — Transfer of Care (Signed)
Immediate Anesthesia Transfer of Care Note  Patient: Erik Clay  Procedure(s) Performed: IRRIGATION AND DEBRIDEMENT PERIRECTAL ABSCESS (N/A Perineum)  Patient Location: PACU  Anesthesia Type:General  Level of Consciousness: drowsy  Airway & Oxygen Therapy: Patient Spontanous Breathing and Patient connected to face mask oxygen  Post-op Assessment: Report given to RN and Post -op Vital signs reviewed and stable  Post vital signs: Reviewed and stable  Last Vitals:  Vitals Value Taken Time  BP 156/90 01/28/2018  5:27 PM  Temp    Pulse 99 01/28/2018  5:38 PM  Resp 12 01/28/2018  5:38 PM  SpO2 92 % 01/28/2018  5:38 PM  Vitals shown include unvalidated device data.  Last Pain:  Vitals:   01/28/18 1715  TempSrc:   PainSc: Asleep         Complications: No apparent anesthesia complications

## 2018-01-28 NOTE — ED Provider Notes (Signed)
[facility]    CSN: [id number] Arrival date & time: 01/28/18  [id number]     History   Chief Complaint Chief Complaint  Patient presents with  . Abscess    HPI [name] is a 57 y.o. male.   57 year old male comes in for few day history of abscess to the left buttock that has been worsening for the past 2 days.  States area has been warm, and getting larger.  And now has discomfort with sitting.  He denies any fever, chills, night sweats. Has not done anything for the symptoms. Has history of abscesses to the area.      Past Medical History:  Diagnosis Date  . GERD (gastroesophageal reflux disease)   . Hypertension     Patient Active Problem List   Diagnosis Date Noted  . Essential hypertension 12/08/2016  . Gastroesophageal reflux disease 12/08/2016    Past Surgical History:  Procedure Laterality Date  . NO PAST SURGERIES         Home Medications    Prior to Admission medications   Medication Sig Start Date End Date Taking? Authorizing Provider  amLODipine (NORVASC) 10 MG tablet Take 1 tablet (10 mg total) by mouth daily. 09/01/17   Claiborne RiggFleming, Zelda W, NP  Esomeprazole Magnesium (NEXIUM PO) Take by mouth.    [provider]  losartan (COZAAR) 50 MG tablet Take 1 tablet (50 mg total) by mouth daily. 09/01/17   Claiborne RiggFleming, Zelda W, NP  nicotine (NICODERM CQ - DOSED IN MG/24 HOURS) 14 mg/24hr patch Place 1 patch (14 mg total) onto the skin daily. 09/01/17   Claiborne RiggFleming, Zelda W, NP  nicotine (NICODERM CQ - DOSED IN MG/24 HR) 7 mg/24hr patch Place 1 patch (7 mg total) onto the skin daily. Take after you complete the 14 mg patches 09/01/17   Claiborne RiggFleming, Zelda W, NP  omeprazole (PRILOSEC) 20 MG capsule Take 1 capsule (20 mg total) by mouth daily. 06/02/17   Claiborne RiggFleming, Zelda W, NP    Family History Family History  Problem Relation Age of Onset  . Diabetes Mother   . Hypertension Mother   . Hypertension Father   . Diabetes Sister   . Hypertension Sister   .  Hypertension Brother   . Hypertension Sister     Social History Social History   Tobacco Use  . Smoking status: Current Every Day Smoker    Packs/day: 0.50    Types: Cigarettes  . Smokeless tobacco: Never Used  Substance Use Topics  . Alcohol use: Yes    Comment: 1-2 per day  . Drug use: No     Allergies   Patient has no known allergies.   Review of Systems Review of Systems  Reason unable to perform ROS: See HPI as above.     Physical Exam Triage Vital Signs ED Triage Vitals  Enc Vitals Group     BP --      Pulse Rate 01/28/18 0840 92     Resp 01/28/18 0840 16     Temp 01/28/18 0840 97.8 F (36.6 C)     Temp Source 01/28/18 0840 Oral     SpO2 01/28/18 0840 100 %     Weight 01/28/18 0841 130 lb (59 kg)     Height --      Head Circumference --      Peak Flow --      Pain Score 01/28/18 0840 10     Pain Loc --  Pain Edu? --      Excl. in GC? --    No data found.  Updated Vital Signs Pulse 92   Temp 97.8 F (36.6 C) (Oral)   Resp 16   Wt 130 lb (59 kg)   SpO2 100%   BMI 19.77 kg/m   Physical Exam  Constitutional: He is oriented to person, place, and time. He appears well-developed and well-nourished. No distress.  HENT:  Head: Normocephalic and atraumatic.  Eyes: Pupils are equal, round, and reactive to light. Conjunctivae are normal.  Genitourinary:  Genitourinary Comments: Swelling, erythema, warmth about 3cm x 3cm to left buttock perirectally. ?fluctuance. Patient with tenderness to palpation of the rectum. On digital exam, had tenderness to palpation of rectal wall.   Neurological: He is alert and oriented to person, place, and time.  Skin: He is not diaphoretic.     UC Treatments / Results  Labs (all labs ordered are listed, but only abnormal results are displayed) Labs Reviewed - No data to display  EKG None  Radiology No results found.  Procedures Procedures (including critical care time)  Medications Ordered in  UC Medications - No data to display  Initial Impression / Assessment and Plan / UC Course  I have reviewed the triage vital signs and the nursing notes.  Pertinent labs & imaging results that were available during my care of the patient were reviewed by me and considered in my medical decision making (see chart for details).    Given perirectal abscess/cellulitis with tenderness to palpation of the rectum, and tenderness to palpation on rectal wall on digital exam, patient discharged in stable condition to the emergency department for further evaluation and management needed.  Final Clinical Impressions(s) / UC Diagnoses   Final diagnoses:  Perirectal cellulitis  Perirectal abscess    ED Prescriptions    None        Belinda Fisher, PA-C 01/28/18 1610

## 2018-01-29 ENCOUNTER — Encounter (HOSPITAL_COMMUNITY): Payer: Self-pay | Admitting: General Surgery

## 2018-02-01 LAB — AEROBIC/ANAEROBIC CULTURE (SURGICAL/DEEP WOUND)

## 2018-02-01 LAB — AEROBIC/ANAEROBIC CULTURE W GRAM STAIN (SURGICAL/DEEP WOUND)

## 2018-02-06 MED FILL — OMEPRAZOLE 20 MG CAP: 20 | 30 days supply | Qty: 30 | Fill #4

## 2018-02-10 ENCOUNTER — Ambulatory Visit: Payer: PRIVATE HEALTH INSURANCE | Attending: Nurse Practitioner | Admitting: Pharmacist

## 2018-02-10 VITALS — BP 150/83 | HR 81

## 2018-02-10 DIAGNOSIS — F17210 Nicotine dependence, cigarettes, uncomplicated: Secondary | ICD-10-CM | POA: Insufficient documentation

## 2018-02-10 DIAGNOSIS — I10 Essential (primary) hypertension: Secondary | ICD-10-CM | POA: Insufficient documentation

## 2018-02-10 NOTE — Patient Instructions (Signed)
Thank you for coming to see us today.   Blood pressure today is elevated.   Continue taking amlodipine, 1 tablet every day. Do not take losartan again until hearing from us about your kidney function.  Start drinking at least 48 oz of water a day and stop taking ibuprofen.   Limiting salt and caffeine, as well as exercising as able for at least 30 minutes for 5 days out of the week, can also help you lower your blood pressure.  Take your blood pressure at home if you are able. Please write down these numbers and bring them to your visits.  If you have any questions about medications, please call me [phone].  Franky MachoLuke

## 2018-02-10 NOTE — Progress Notes (Signed)
   S:    PCP: Zelda   Patient arrives in good spirits. Presents to the clinic for hypertension evaluation, counseling, and management. Patient was referred by Zelda on 09/01/2017.   Today, pt denies chest pain, headache, blurred vision, or shortness of breath. Denies BLE edema.   Patient reports adherence with medications.  Current BP Medications include:  Amlodipine 10 mg, losartan 50 mg  Dietary habits include: does not limit salt, drinks soda throughout the day Exercise habits include: walks "constantly" Family / Social history: HTN (mother, father, both sisters), current everyday smoker (0.5 PPD), 40 oz beer every 3-4 days.  Home BP readings:  Does not take at home  O:  L arm after 5 minutes rest: 150/83  Last 3 Office BP readings: BP Readings from Last 3 Encounters:  02/10/18 (!) 150/83  01/28/18 135/84  09/01/17 (!) 148/99   BMET    Component Value Date/Time   NA 137 02/10/2018 1628   K 4.9 02/10/2018 1628   CL 98 02/10/2018 1628   CO2 21 02/10/2018 1628   GLUCOSE 186 (H) 02/10/2018 1628   GLUCOSE 101 (H) 01/28/2018 1158   BUN 9 02/10/2018 1628   CREATININE 1.06 02/10/2018 1628   CALCIUM 9.9 02/10/2018 1628   GFRNONAA 79 02/10/2018 1628   GFRAA 91 02/10/2018 1628   Renal function: CrCl cannot be calculated (Unknown ideal weight.).  Clinical ASCVD: No  The 10-year ASCVD risk score Denman George(Goff DC Jr., et al., 2013) is: 20.4%   Values used to calculate the score:     Age: 57 years     Sex: Male     Is Non-Hispanic African American: Yes     Diabetic: No     Tobacco smoker: Yes     Systolic Blood Pressure: 150 mmHg     Is BP treated: Yes     HDL Cholesterol: 89 mg/dL     Total Cholesterol: 198 mg/dL  A/P: Hypertension longstanding currently uncontrolled on current medications. BP Goal <130/80 mmHg. Patient is adherent with current medications. Renal function ordered 02/10/18 and evaluated 02/11/18. Creatinine has decreased to near baseline. Will have patient  continue losartan and amlodipine.   -Continued amlodipine and losartan.  -F/u labs ordered - CMP14+GFR -Counseled on lifestyle modifications for blood pressure control including reduced dietary sodium, increased exercise, adequate sleep  Results reviewed and written information provided. Total time in face-to-face counseling 15 minutes.   F/U Clinic Visit 02/18/18.    Butch PennyLuke Van Ausdall, PharmD, CPP Clinical Pharmacist [facility] [phone]

## 2018-02-11 ENCOUNTER — Telehealth: Payer: Self-pay | Admitting: Pharmacist

## 2018-02-11 ENCOUNTER — Encounter: Payer: Self-pay | Admitting: Pharmacist

## 2018-02-11 LAB — CMP14+EGFR
ALBUMIN: 4.5 g/dL (ref 3.5–5.5)
ALK PHOS: 79 IU/L (ref 39–117)
ALT: 10 IU/L (ref 0–44)
AST: 18 IU/L (ref 0–40)
Albumin/Globulin Ratio: 1.4 (ref 1.2–2.2)
BILIRUBIN TOTAL: 0.3 mg/dL (ref 0.0–1.2)
BUN/Creatinine Ratio: 8 — ABNORMAL LOW (ref 9–20)
BUN: 9 mg/dL (ref 6–24)
CALCIUM: 9.9 mg/dL (ref 8.7–10.2)
CHLORIDE: 98 mmol/L (ref 96–106)
CO2: 21 mmol/L (ref 20–29)
Creatinine, Ser: 1.06 mg/dL (ref 0.76–1.27)
GFR, EST AFRICAN AMERICAN: 91 mL/min/{1.73_m2} (ref >59)
GFR, EST NON AFRICAN AMERICAN: 79 mL/min/{1.73_m2} (ref >59)
GLOBULIN, TOTAL: 3.2 g/dL (ref 1.5–4.5)
Glucose: 186 mg/dL — ABNORMAL HIGH (ref 65–99)
Potassium: 4.9 mmol/L (ref 3.5–5.2)
SODIUM: 137 mmol/L (ref 134–144)
Total Protein: 7.7 g/dL (ref 6.0–8.5)

## 2018-02-11 NOTE — Telephone Encounter (Signed)
Noted. Agree.

## 2018-02-11 NOTE — Telephone Encounter (Signed)
Called pt concerning creatinine. Has decreased to near baseline. Advised patient to resume losartan. He is also taking amlodipine; encouraged continued compliance. Encouraged pt to keep f/u appointment with Zelda.

## 2018-02-17 MED FILL — AMLODIPINE BESYLATE 10 MG T: 10 | 30 days supply | Qty: 30 | Fill #5

## 2018-02-18 ENCOUNTER — Encounter: Payer: Self-pay | Admitting: Nurse Practitioner

## 2018-02-18 ENCOUNTER — Other Ambulatory Visit: Payer: Self-pay

## 2018-02-18 ENCOUNTER — Ambulatory Visit: Payer: PRIVATE HEALTH INSURANCE | Attending: Nurse Practitioner | Admitting: Nurse Practitioner

## 2018-02-18 VITALS — BP 147/105 | HR 75 | Temp 97.5°F | Resp 16 | Wt 111.8 lb

## 2018-02-18 DIAGNOSIS — F17210 Nicotine dependence, cigarettes, uncomplicated: Secondary | ICD-10-CM | POA: Diagnosis not present

## 2018-02-18 DIAGNOSIS — I10 Essential (primary) hypertension: Secondary | ICD-10-CM | POA: Insufficient documentation

## 2018-02-18 DIAGNOSIS — K219 Gastro-esophageal reflux disease without esophagitis: Secondary | ICD-10-CM | POA: Insufficient documentation

## 2018-02-18 DIAGNOSIS — F17200 Nicotine dependence, unspecified, uncomplicated: Secondary | ICD-10-CM

## 2018-02-18 DIAGNOSIS — Z23 Encounter for immunization: Secondary | ICD-10-CM | POA: Diagnosis not present

## 2018-02-18 DIAGNOSIS — Z79899 Other long term (current) drug therapy: Secondary | ICD-10-CM | POA: Insufficient documentation

## 2018-02-18 DIAGNOSIS — Z8249 Family history of ischemic heart disease and other diseases of the circulatory system: Secondary | ICD-10-CM | POA: Insufficient documentation

## 2018-02-18 MED ORDER — AMLODIPINE BESYLATE 10 MG PO TABS: 10.0000 mg | ORAL_TABLET | Freq: Every day | ORAL | 1 refills | Status: DC

## 2018-02-18 MED ORDER — LOSARTAN POTASSIUM 100 MG PO TABS: 50.0000 mg | ORAL_TABLET | Freq: Every day | ORAL | 2 refills | Status: DC

## 2018-02-18 MED FILL — LOSARTAN POTASSIUM 100 MG T: 100 | 30 days supply | Qty: 30 | Fill #0

## 2018-02-18 NOTE — Progress Notes (Signed)
Assessment & Plan:  Gerlene BurdockRichard was seen today for hypertension.  Diagnoses and all orders for this visit:  Essential hypertension -     losartan (COZAAR) 100 MG tablet; Take 0.5 tablets (50 mg total) by mouth daily. -     amLODipine (NORVASC) 10 MG tablet; Take 1 tablet (10 mg total) by mouth daily. Continue all antihypertensives as prescribed.  Remember to bring in your blood pressure log with you for your follow up appointment.  DASH/Mediterranean Diets are healthier choices for HTN.   Tobacco dependence Gerlene BurdockRichard was counseled on the dangers of tobacco use, and was advised to quit. Reviewed strategies to maximize success, including removing cigarettes and smoking materials from environment, stress management and support of family/friends as well as pharmacological alternatives including: Wellbutrin, Chantix, Nicotine patch, Nicotine gum or lozenges. Smoking cessation support: smoking cessation hotline: 1-800-QUIT-NOW.  Smoking cessation classes are also available through [facility]. Call [phone] or visit our website at [email or web address].   A total of 4 minutes was spent on counseling for smoking cessation and Gerlene BurdockRichard is not ready to quit.    Patient has been counseled on age-appropriate routine health concerns for screening and prevention. These are reviewed and up-to-date. Referrals have been placed accordingly. Immunizations are up-to-date or declined.    Subjective:   Chief Complaint  Patient presents with  . Hypertension   HPI Harvest A Dileo 57 y.o. male presents to office today for follow up to HTN.  Essential Hypertension Blood pressure today is still elevated today.   BP Readings from Last 3 Encounters:  02/18/18 (!) 147/105  02/10/18 (!) 150/83  01/28/18 135/84  Current medications include amlodipine 10mg , losartan 50mg  daily. He endorses daily  medication compliance. Will increase losartan today to 100mg . He denies chest pain, shortness  of breath, palpitations, lightheadedness, dizziness, headaches or BLE edema.    Tobacco Dependence He wants to stop smoking but states he wants to wait until the first of the year to stop smoking.  He states "let's talk about this in January". He was prescribed nicotine patches a few months ago but did not pick them up.   Review of Systems  Constitutional: Negative for fever, malaise/fatigue and weight loss.  HENT: Negative.  Negative for nosebleeds.   Eyes: Negative.  Negative for blurred vision, double vision and photophobia.  Respiratory: Negative.  Negative for cough and shortness of breath.   Cardiovascular: Negative.  Negative for chest pain, palpitations and leg swelling.  Gastrointestinal: Positive for heartburn. Negative for nausea and vomiting.  Musculoskeletal: Negative.  Negative for myalgias.  Neurological: Negative.  Negative for dizziness, focal weakness, seizures and headaches.  Psychiatric/Behavioral: Negative.  Negative for suicidal ideas.    Past Medical History:  Diagnosis Date  . GERD (gastroesophageal reflux disease)   . Hypertension     Past Surgical History:  Procedure Laterality Date  . INCISION AND DRAINAGE PERIRECTAL ABSCESS N/A 01/28/2018   Procedure: IRRIGATION AND DEBRIDEMENT PERIRECTAL ABSCESS;  Surgeon: Sheliah HatchKinsinger, De BlanchLuke Aaron, MD;  Location: [facility] OR;  Service: General;  Laterality: N/A;  . NO PAST SURGERIES      Family History  Problem Relation Age of Onset  . Diabetes Mother   . Hypertension Mother   . Hypertension Father   . Diabetes Sister   . Hypertension Sister   . Hypertension Brother   . Hypertension Sister     Social History Reviewed with no changes to be made today.   Outpatient Medications Prior to Visit  Medication Sig Dispense Refill  . Esomeprazole Magnesium (NEXIUM PO) Take by mouth.    Marland Kitchen HYDROcodone-acetaminophen (NORCO) 5-325 MG tablet Take 1 tablet by mouth every 4 (four) hours as needed for moderate pain. 10 tablet 0  .  ibuprofen (ADVIL,MOTRIN) 800 MG tablet Take 1 tablet (800 mg total) by mouth every 8 (eight) hours as needed for up to 30 doses. 30 tablet 0  . omeprazole (PRILOSEC) 20 MG capsule Take 1 capsule (20 mg total) by mouth daily. 90 capsule 1  . amLODipine (NORVASC) 10 MG tablet Take 1 tablet (10 mg total) by mouth daily. 90 tablet 1  . losartan (COZAAR) 50 MG tablet Take 1 tablet (50 mg total) by mouth daily. 90 tablet 3  . nicotine (NICODERM CQ - DOSED IN MG/24 HOURS) 14 mg/24hr patch Place 1 patch (14 mg total) onto the skin daily. (Patient not taking: Reported on 02/18/2018) 36 patch 0  . nicotine (NICODERM CQ - DOSED IN MG/24 HR) 7 mg/24hr patch Place 1 patch (7 mg total) onto the skin daily. Take after you complete the 14 mg patches 28 patch 0   No facility-administered medications prior to visit.     No Known Allergies     Objective:    BP (!) 147/105   Pulse 75   Temp (!) 97.5 F (36.4 C) (Oral)   Resp 16   Wt 111 lb 12.8 oz (50.7 kg)   SpO2 100%   BMI 16.51 kg/m  Wt Readings from Last 3 Encounters:  02/18/18 111 lb 12.8 oz (50.7 kg)  01/28/18 130 lb (59 kg)  01/28/18 130 lb (59 kg)    Physical Exam Vitals signs and nursing note reviewed.  Constitutional:      Appearance: He is well-developed.  HENT:     Head: Normocephalic and atraumatic.  Neck:     Musculoskeletal: Normal range of motion.  Cardiovascular:     Rate and Rhythm: Normal rate and regular rhythm.     Heart sounds: Normal heart sounds. No murmur. No friction rub. No gallop.   Pulmonary:     Effort: Pulmonary effort is normal. No tachypnea or respiratory distress.     Breath sounds: Normal breath sounds. No decreased breath sounds, wheezing, rhonchi or rales.  Chest:     Chest wall: No tenderness.  Abdominal:     General: Bowel sounds are normal.     Palpations: Abdomen is soft.  Musculoskeletal: Normal range of motion.  Skin:    General: Skin is warm and dry.  Neurological:     Mental Status: He is  alert and oriented to person, place, and time.     Coordination: Coordination normal.  Psychiatric:        Behavior: Behavior normal. Behavior is cooperative.        Thought Content: Thought content normal.        Judgment: Judgment normal.          Patient has been counseled extensively about nutrition and exercise as well as the importance of adherence with medications and regular follow-up. The patient was given clear instructions to go to ER or return to medical center if symptoms don't improve, worsen or new problems develop. The patient verbalized understanding.   Follow-up: Return in about 4 weeks (around 03/18/2018) for BP recheck.   Claiborne Rigg, FNP-BC [facility] [place], [place] [phone]   02/18/2018, 9:37 AM

## 2018-02-18 NOTE — Progress Notes (Signed)
Follow up HTN 

## 2018-02-18 NOTE — Patient Instructions (Signed)
DASH Eating Plan  DASH stands for "Dietary Approaches to Stop Hypertension." The DASH eating plan is a healthy eating plan that has been shown to reduce high blood pressure (hypertension). It may also reduce your risk for type 2 diabetes, heart disease, and stroke. The DASH eating plan may also help with weight loss.  What are tips for following this plan?    General guidelines   Avoid eating more than 2,300 mg (milligrams) of salt (sodium) a day. If you have hypertension, you may need to reduce your sodium intake to 1,500 mg a day.   Limit alcohol intake to no more than 1 drink a day for nonpregnant women and 2 drinks a day for men. One drink equals 12 oz of beer, 5 oz of wine, or 1 oz of hard liquor.   Work with your health care provider to maintain a healthy body weight or to lose weight. Ask what an ideal weight is for you.   Get at least 30 minutes of exercise that causes your heart to beat faster (aerobic exercise) most days of the week. Activities may include walking, swimming, or biking.   Work with your health care provider or diet and nutrition specialist (dietitian) to adjust your eating plan to your individual calorie needs.  Reading food labels     Check food labels for the amount of sodium per serving. Choose foods with less than 5 percent of the Daily Value of sodium. Generally, foods with less than 300 mg of sodium per serving fit into this eating plan.   To find whole grains, look for the word "whole" as the first word in the ingredient list.  Shopping   Buy products labeled as "low-sodium" or "no salt added."   Buy fresh foods. Avoid canned foods and premade or frozen meals.  Cooking   Avoid adding salt when cooking. Use salt-free seasonings or herbs instead of table salt or sea salt. Check with your health care provider or pharmacist before using salt substitutes.   Do not fry foods. Cook foods using healthy methods such as baking, boiling, grilling, and broiling instead.   Cook with  heart-healthy oils, such as olive, canola, soybean, or sunflower oil.  Meal planning   Eat a balanced diet that includes:  ? 5 or more servings of fruits and vegetables each day. At each meal, try to fill half of your plate with fruits and vegetables.  ? Up to 6-8 servings of whole grains each day.  ? Less than 6 oz of lean meat, poultry, or fish each day. A 3-oz serving of meat is about the same size as a deck of cards. One egg equals 1 oz.  ? 2 servings of low-fat dairy each day.  ? A serving of nuts, seeds, or beans 5 times each week.  ? Heart-healthy fats. Healthy fats called Omega-3 fatty acids are found in foods such as flaxseeds and coldwater fish, like sardines, salmon, and mackerel.   Limit how much you eat of the following:  ? Canned or prepackaged foods.  ? Food that is high in trans fat, such as fried foods.  ? Food that is high in saturated fat, such as fatty meat.  ? Sweets, desserts, sugary drinks, and other foods with added sugar.  ? Full-fat dairy products.   Do not salt foods before eating.   Try to eat at least 2 vegetarian meals each week.   Eat more home-cooked food and less restaurant, buffet, and fast food.     When eating at a restaurant, ask that your food be prepared with less salt or no salt, if possible.  What foods are recommended?  The items listed may not be a complete list. Talk with your dietitian about what dietary choices are best for you.  Grains  Whole-grain or whole-wheat bread. Whole-grain or whole-wheat pasta. Brown rice. Oatmeal. Quinoa. Bulgur. Whole-grain and low-sodium cereals. Pita bread. Low-fat, low-sodium crackers. Whole-wheat flour tortillas.  Vegetables  Fresh or frozen vegetables (raw, steamed, roasted, or grilled). Low-sodium or reduced-sodium tomato and vegetable juice. Low-sodium or reduced-sodium tomato sauce and tomato paste. Low-sodium or reduced-sodium canned vegetables.  Fruits  All fresh, dried, or frozen fruit. Canned fruit in natural juice (without  added sugar).  Meat and other protein foods  Skinless chicken or turkey. Ground chicken or turkey. Pork with fat trimmed off. Fish and seafood. Egg whites. Dried beans, peas, or lentils. Unsalted nuts, nut butters, and seeds. Unsalted canned beans. Lean cuts of beef with fat trimmed off. Low-sodium, lean deli meat.  Dairy  Low-fat (1%) or fat-free (skim) milk. Fat-free, low-fat, or reduced-fat cheeses. Nonfat, low-sodium ricotta or cottage cheese. Low-fat or nonfat yogurt. Low-fat, low-sodium cheese.  Fats and oils  Soft margarine without trans fats. Vegetable oil. Low-fat, reduced-fat, or light mayonnaise and salad dressings (reduced-sodium). Canola, safflower, olive, soybean, and sunflower oils. Avocado.  Seasoning and other foods  Herbs. Spices. Seasoning mixes without salt. Unsalted popcorn and pretzels. Fat-free sweets.  What foods are not recommended?  The items listed may not be a complete list. Talk with your dietitian about what dietary choices are best for you.  Grains  Baked goods made with fat, such as croissants, muffins, or some breads. Dry pasta or rice meal packs.  Vegetables  Creamed or fried vegetables. Vegetables in a cheese sauce. Regular canned vegetables (not low-sodium or reduced-sodium). Regular canned tomato sauce and paste (not low-sodium or reduced-sodium). Regular tomato and vegetable juice (not low-sodium or reduced-sodium). Pickles. Olives.  Fruits  Canned fruit in a light or heavy syrup. Fried fruit. Fruit in cream or butter sauce.  Meat and other protein foods  Fatty cuts of meat. Ribs. Fried meat. Bacon. Sausage. Bologna and other processed lunch meats. Salami. Fatback. Hotdogs. Bratwurst. Salted nuts and seeds. Canned beans with added salt. Canned or smoked fish. Whole eggs or egg yolks. Chicken or turkey with skin.  Dairy  Whole or 2% milk, cream, and half-and-half. Whole or full-fat cream cheese. Whole-fat or sweetened yogurt. Full-fat cheese. Nondairy creamers. Whipped toppings.  Processed cheese and cheese spreads.  Fats and oils  Butter. Stick margarine. Lard. Shortening. Ghee. Bacon fat. Tropical oils, such as coconut, palm kernel, or palm oil.  Seasoning and other foods  Salted popcorn and pretzels. Onion salt, garlic salt, seasoned salt, table salt, and sea salt. Worcestershire sauce. Tartar sauce. Barbecue sauce. Teriyaki sauce. Soy sauce, including reduced-sodium. Steak sauce. Canned and packaged gravies. Fish sauce. Oyster sauce. Cocktail sauce. Horseradish that you find on the shelf. Ketchup. Mustard. Meat flavorings and tenderizers. Bouillon cubes. Hot sauce and Tabasco sauce. Premade or packaged marinades. Premade or packaged taco seasonings. Relishes. Regular salad dressings.  Where to find more information:   National Heart, Lung, and Blood Institute: www.nhlbi.nih.gov   American Heart Association: www.heart.org  Summary   The DASH eating plan is a healthy eating plan that has been shown to reduce high blood pressure (hypertension). It may also reduce your risk for type 2 diabetes, heart disease, and stroke.   With the   DASH eating plan, you should limit salt (sodium) intake to 2,300 mg a day. If you have hypertension, you may need to reduce your sodium intake to 1,500 mg a day.   When on the DASH eating plan, aim to eat more fresh fruits and vegetables, whole grains, lean proteins, low-fat dairy, and heart-healthy fats.   Work with your health care provider or diet and nutrition specialist (dietitian) to adjust your eating plan to your individual calorie needs.  This information is not intended to replace advice given to you by your health care provider. Make sure you discuss any questions you have with your health care provider.  Document Released: 02/07/2011 Document Revised: 02/12/2016 Document Reviewed: 02/12/2016  Elsevier Interactive Patient Education  2019 Elsevier Inc.

## 2018-03-17 ENCOUNTER — Ambulatory Visit: Payer: Self-pay | Admitting: Surgery

## 2018-03-17 NOTE — H&P (Signed)
CC: Referral from Dr. 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 Sheliah Hatch. Penrose drain was placed during surgery. Our PA in the office, Puja, removed the drain on 02/17/18 at which time he had an anal growth, which he described as his "hemorrhoids." He stated it appeared about 1-2 days prior to that appointment. His plan was to use Preparation H and to follow-up in 2 weeks. He was seen and evaluated and referred to me further evaluation. He reports these have been present for approximately one month. Prior to this he denies any known anal lesions or "hemorrhoids." He reports having recovered well from his surgical procedure. He denies any recurrent abscesses, pain, or persistent drainage. He states all the wounds have healed up well. He denies any history of warts in this location or ulcer on his body in the past. He denies any history of STI's. He reports currently being sexually inactive. He denies any history of receptive anal intercourse.  He denies ever having had a colonoscopy  Per H&P from hospital admission, he did have a perirectal abscess on his right buttock about a year ago.  PMH: HTN (well-controlled on oral antihypertensive); tobacco abuse (uncontrolled)  PSH: Incision and drainage of left perirectal abscess 01/28/18  FHx: Denies FHx of malignancy  Social: +Tobacco abuse; reports drinking a "couple beers" per day - denies withdrawal sxs if he misses a drink; denies illicit drug use  ROS: A comprehensive 10 system review of systems was completed with the patient and pertinent findings as noted above.  The patient is a 58 year old male.   Allergies (Tanisha A. Manson Passey, RMA; 03/17/2018 9:30 AM) No Known Drug Allergies [02/17/2018]: Allergies Reconciled   Medication History (Tanisha A. Manson Passey,  RMA; 03/17/2018 9:30 AM) Losartan Potassium (100MG  Tablet, Oral) Active. Omeprazole (20MG  Capsule DR, Oral) Active. amLODIPine Besylate (10MG  Tablet, Oral) Active. Medications Reconciled    Review of Systems Cristal Deer M. Airabella Barley MD; 03/17/2018 9:38 AM) General Not Present- Chills and Fever. Skin Not Present- Pruritus and Rash. Respiratory Not Present- Cough and Decreased Exercise Tolerance. Cardiovascular Not Present- Chest Pain and Shortness of Breath. Gastrointestinal Not Present- Abdominal Pain and Rectal Bleeding. Male Genitourinary Not Present- Frequency and Hematuria. Musculoskeletal Not Present- Calf Pain and Decreased Range of Motion. Psychiatric Not Present- Anxiety, Bipolar, Change in Sleep Pattern, Depression, Fearful and Frequent crying. Endocrine Not Present- Cold Intolerance, Excessive Hunger, Hair Changes, Heat Intolerance and New Diabetes. Hematology Not Present- Blood Thinners, Easy Bruising, Excessive bleeding, Gland problems, HIV and Persistent Infections.  Vitals (Tanisha A. Brown RMA; 03/17/2018 9:30 AM) 03/17/2018 9:30 AM Weight: 110.6 lb Height: 71in Body Surface Area: 1.64 m Body Mass Index: 15.43 kg/m  Temp.: 97.22F  Pulse: 89 (Regular)  BP: 126/84 (Sitting, Left Arm, Standard)       Physical Exam Cristal Deer M. Odalys Win MD; 03/17/2018 9:52 AM) The physical exam findings are as follows: Note:Constitutional: No acute distress; conversant; no deformities. Quite thin Eyes: Moist conjunctiva; no lid lag; anicteric sclerae; pupils equal round and reactive to light Neck: Trachea midline; no palpable thyromegaly Lungs: Normal respiratory effort; no tactile fremitus CV: Regular rate and rhythm; no palpable thrill; no pitting edema GI: Abdomen soft, nontender, nondistended; no palpable hepatosplenomegaly. Anorectal: Incision and drainage site has completely closed. There is no active drainage. Left lateral-appears to be a  prolapsing internal  hemorrhoid with condylomatous like change on the surface. This is soft. Small external skin tags. DRE-good/increased tone. No palpable masses in the anal canal or distal rectum. Anoscopy: Left lateral-prolapsing internal hemorrhoid with condylomatous like changes on the surface. No other anal canal abnormalities were identified. MSK: Normal gait; no clubbing/cyanosis Psychiatric: Appropriate affect; alert and oriented 3 Lymphatic: No palpable cervical OR inguinal lymphadenopathy **A chaperone, Erik Clay, was present for the entire physical exam    Assessment & Plan Cristal Deer(Joslyn Ramos M. Bijal Siglin MD; 03/17/2018 9:56 AM) MASS OF ANUS (K62.89) Story: Erik Clay is a very pleasant 57yoM with hx of HTN, tobacco abuse here with prolapsing left lateral internal hemorrhoid with overlying polypoid changes Impression: -The anatomy and physiology of the anal canal was discussed at length with the patient. The pathophysiology of anal canal masses and polyps/condyloma was discussed at length with associated pictures and illustrations using the ASCRS handout -We discussed options going forward. Given that this has abnormal overlying mucosa, I have recommended examination under anesthesia with excision of the left lateral hemorrhoid bundle for further evaluation. We discussed risks of nonoperative management including change in lesion, progression if this is a malignancy. -The planned procedure, material risks (including, but not limited to, pain, bleeding, infection, scarring, need for blood transfusion, damage to anal sphincter, incontinence of gas and/or stool, need for additional procedures, pelvic sepsis, recurrence, pneumonia, heart attack, stroke, death) benefits and alternatives to surgery were discussed at length. The patient's questions were answered to his satisfaction, he voiced understanding and elected to proceed with surgery. Additionally, we discussed typical postoperative expectations and the recovery  process. -Referral placed to [facility] for colonoscopy (his PCP has placed this recently in the past but he didn't have transportation at that time) Current Plans ANOSCOPY, DIAGNOSTIC ([id number]) (Anoscopy: Left lateral-prolapsing internal hemorrhoid with condylomatous like changes on the surface. No other anal canal abnormalities were identified.) Signed electronically by Andria Meusehristopher M Marquisha Nikolov, MD (03/17/2018 9:56 AM)

## 2018-03-23 ENCOUNTER — Ambulatory Visit: Payer: PRIVATE HEALTH INSURANCE | Admitting: Nurse Practitioner

## 2018-03-23 MED FILL — AMLODIPINE BESYLATE 10 MG T: 10 | 10 days supply | Qty: 30 | Fill #0

## 2018-03-27 MED FILL — OMEPRAZOLE 20 MG CAP: 20 | 30 days supply | Qty: 30 | Fill #5

## 2018-04-03 MED FILL — LOSARTAN POTASSIUM 100 MG T: 100 | 30 days supply | Qty: 30 | Fill #1

## 2018-04-16 MED FILL — LOSARTAN POTASSIUM 100 MG T: 100 | 30 days supply | Qty: 30 | Fill #1

## 2018-04-29 ENCOUNTER — Other Ambulatory Visit: Payer: Self-pay

## 2018-04-29 ENCOUNTER — Ambulatory Visit (HOSPITAL_COMMUNITY)
Admission: EM | Admit: 2018-04-29 | Discharge: 2018-04-29 | Disposition: A | Discharge status: Home / Self Care | Payer: PRIVATE HEALTH INSURANCE | Attending: Family Medicine | Admitting: Family Medicine

## 2018-04-29 ENCOUNTER — Encounter (HOSPITAL_COMMUNITY): Payer: Self-pay | Admitting: Emergency Medicine

## 2018-04-29 DIAGNOSIS — L0231 Cutaneous abscess of buttock: Secondary | ICD-10-CM

## 2018-04-29 MED ORDER — LIDOCAINE HCL (PF) 2 % IJ SOLN
INTRAMUSCULAR | Status: AC
  Filled 2018-04-29: qty 2

## 2018-04-29 MED ORDER — CEFTRIAXONE SODIUM 1 G IJ SOLR
INTRAMUSCULAR | Status: AC
  Filled 2018-04-29: qty 10

## 2018-04-29 MED ORDER — AMOXICILLIN-POT CLAVULANATE 875-125 MG PO TABS: 1.0000 | ORAL_TABLET | Freq: Two times a day (BID) | ORAL | 0 refills | Status: DC

## 2018-04-29 MED ORDER — CEFTRIAXONE SODIUM 1 G IJ SOLR
1.0000 g | Freq: Once | INTRAMUSCULAR | Status: AC
  Administered 2018-04-29: 1 g via INTRAMUSCULAR

## 2018-04-29 NOTE — ED Provider Notes (Signed)
[facility]    CSN: [id number] Arrival date & time: 04/29/18  1654     History   Chief Complaint Chief Complaint  Patient presents with  . Abscess    HPI Erik Clay is a 58 y.o. male.   This is a recurring problem.  Patient was seen in this office 4 months ago and sent to the emergency room where he underwent surgery for a perirectal abscess.  There is no discharge summary or culture sensitivity testing to guide treatment today.  The symptoms started 2 days prior to being seen  HPI  Past Medical History:  Diagnosis Date  . GERD (gastroesophageal reflux disease)   . Hypertension     Patient Active Problem List   Diagnosis Date Noted  . Essential hypertension 12/08/2016  . Gastroesophageal reflux disease 12/08/2016    Past Surgical History:  Procedure Laterality Date  . INCISION AND DRAINAGE PERIRECTAL ABSCESS N/A 01/28/2018   Procedure: IRRIGATION AND DEBRIDEMENT PERIRECTAL ABSCESS;  Surgeon: Sheliah Hatch, De Blanch, MD;  Location: [facility];  Service: General;  Laterality: N/A;  . NO PAST SURGERIES         Home Medications    Prior to Admission medications   Medication Sig Start Date End Date Taking? Authorizing Provider  amLODipine (NORVASC) 10 MG tablet Take 1 tablet (10 mg total) by mouth daily. 02/18/18  Yes Claiborne Rigg, NP  Esomeprazole Magnesium (NEXIUM PO) Take by mouth.   Yes [provider]  omeprazole (PRILOSEC) 20 MG capsule Take 1 capsule (20 mg total) by mouth daily. 06/02/17  Yes Claiborne Rigg, NP  HYDROcodone-acetaminophen (NORCO) 5-325 MG tablet Take 1 tablet by mouth every 4 (four) hours as needed for moderate pain. 01/28/18   Berna Bue, MD  ibuprofen (ADVIL,MOTRIN) 800 MG tablet Take 1 tablet (800 mg total) by mouth every 8 (eight) hours as needed for up to 30 doses. 01/28/18   Berna Bue, MD  losartan (COZAAR) 100 MG tablet Take 0.5 tablets (50 mg total) by mouth daily. 02/18/18 03/20/18  Claiborne Rigg,  NP  nicotine (NICODERM CQ - DOSED IN MG/24 HOURS) 14 mg/24hr patch Place 1 patch (14 mg total) onto the skin daily. Patient not taking: Reported on 02/18/2018 09/01/17   Claiborne Rigg, NP    Family History Family History  Problem Relation Age of Onset  . Diabetes Mother   . Hypertension Mother   . Hypertension Father   . Diabetes Sister   . Hypertension Sister   . Hypertension Brother   . Hypertension Sister     Social History Social History   Tobacco Use  . Smoking status: Current Every Day Smoker    Packs/day: 0.50    Types: Cigarettes  . Smokeless tobacco: Never Used  Substance Use Topics  . Alcohol use: Yes    Comment: 1-2 per day  . Drug use: No     Allergies   Patient has no known allergies.   Review of Systems Review of Systems  Skin: Positive for wound.  All other systems reviewed and are negative.    Physical Exam Triage Vital Signs ED Triage Vitals [04/29/18 1735]  Enc Vitals Group     BP 115/78     Pulse Rate 84     Resp      Temp 98.8 F (37.1 C)     Temp Source Temporal     SpO2 99 %     Weight  Height      Head Circumference      Peak Flow      Pain Score 8     Pain Loc      Pain Edu?      Excl. in GC?    No data found.  Updated Vital Signs BP 115/78 (BP Location: Left Arm)   Pulse 84   Temp 98.8 F (37.1 C) (Temporal)   SpO2 99%   Visual Acuity Right Eye Distance:   Left Eye Distance:   Bilateral Distance:    Right Eye Near:   Left Eye Near:    Bilateral Near:     Physical Exam Vitals signs and nursing note reviewed.  Constitutional:      Appearance: Normal appearance.  Cardiovascular:     Rate and Rhythm: Normal rate and regular rhythm.  Pulmonary:     Effort: Pulmonary effort is normal.     Breath sounds: Normal breath sounds.  Skin:    Comments: Left buttock: There is some tenderness but no induration or fluctuance left inner buttock.  There is some scarring from prior surgeries and incision and  drainage. I do not feel this needs I&D tonight but will treat him with antibiotic and sits baths or warm compresses  Neurological:     Mental Status: He is alert.      UC Treatments / Results  Labs (all labs ordered are listed, but only abnormal results are displayed) Labs Reviewed - No data to display  EKG None  Radiology No results found.  Procedures Procedures (including critical care time)  Medications Ordered in UC Medications - No data to display  Initial Impression / Assessment and Plan / UC Course  I have reviewed the triage vital signs and the nursing notes.  Pertinent labs & imaging results that were available during my care of the patient were reviewed by me and considered in my medical decision making (see chart for details).     Left buttock abscess.  Based on previous cultures there were some Bacteroides as well as strep species.  There was no MRSA. Final Clinical Impressions(s) / UC Diagnoses   Final diagnoses:  None   Discharge Instructions   None    ED Prescriptions    None     Controlled Substance Prescriptions [place] Controlled Substance Registry consulted? No   Frederica Kuster, MD 04/29/18 [phone]

## 2018-04-29 NOTE — ED Triage Notes (Signed)
Pt reports an abscess to his left buttocks that formed 3 days ago.  He states this is a recurrent issue.

## 2018-04-29 NOTE — Discharge Instructions (Addendum)
Sit in warm bath tub or apply compresses 3-4 times a day

## 2018-05-07 ENCOUNTER — Other Ambulatory Visit: Payer: Self-pay | Admitting: Nurse Practitioner

## 2018-05-07 DIAGNOSIS — K219 Gastro-esophageal reflux disease without esophagitis: Secondary | ICD-10-CM

## 2018-05-07 MED FILL — AMLODIPINE BESYLATE 10 MG T: 10 | 10 days supply | Qty: 30 | Fill #1

## 2018-05-08 MED FILL — OMEPRAZOLE 20 MG CAP: 20 | 30 days supply | Qty: 90 | Fill #0

## 2018-05-29 ENCOUNTER — Ambulatory Visit: Payer: PRIVATE HEALTH INSURANCE | Admitting: Nurse Practitioner

## 2018-05-29 ENCOUNTER — Encounter (INDEPENDENT_AMBULATORY_CARE_PROVIDER_SITE_OTHER): Payer: Self-pay

## 2018-06-01 ENCOUNTER — Encounter: Payer: Self-pay | Admitting: Nurse Practitioner

## 2018-06-01 ENCOUNTER — Other Ambulatory Visit: Payer: Self-pay

## 2018-06-01 ENCOUNTER — Ambulatory Visit: Payer: PRIVATE HEALTH INSURANCE | Attending: Nurse Practitioner | Admitting: Nurse Practitioner

## 2018-06-01 DIAGNOSIS — H9201 Otalgia, right ear: Secondary | ICD-10-CM

## 2018-06-01 DIAGNOSIS — I10 Essential (primary) hypertension: Secondary | ICD-10-CM

## 2018-06-01 MED ORDER — AMLODIPINE BESYLATE 10 MG PO TABS: 10.0000 mg | ORAL_TABLET | Freq: Every day | ORAL | 0 refills | Status: DC

## 2018-06-01 MED ORDER — MUPIROCIN 2 % EX OINT: TOPICAL_OINTMENT | CUTANEOUS | 0 refills | Status: DC

## 2018-06-01 MED ORDER — LOSARTAN POTASSIUM 50 MG PO TABS: 50.0000 mg | ORAL_TABLET | Freq: Every day | ORAL | 0 refills | Status: DC

## 2018-06-01 MED FILL — LOSARTAN POTASSIUM 100 MG T: 100 | 30 days supply | Qty: 15 | Fill #2

## 2018-06-01 NOTE — Progress Notes (Signed)
Assessment & Plan:  Erik Clay was seen today for ear pain.  Diagnoses and all orders for this visit:  Acute ear pain, right -     mupirocin ointment (BACTROBAN) 2 %; One application to affected area 2 (two) times per day. Go the ER or urgent care if you develop increased purulent drainage, pain or bleeding from the affected ear   Essential hypertension -     losartan (COZAAR) 50 MG tablet; Take 1 tablet (50 mg total) by mouth daily. -     amLODipine (NORVASC) 10 MG tablet; Take 1 tablet (10 mg total) by mouth daily. Continue all antihypertensives as prescribed.  Remember to bring in your blood pressure log with you for your follow up appointment.  DASH/Mediterranean Diets are healthier choices for HTN.    Patient has been counseled on age-appropriate routine health concerns for screening and prevention. These are reviewed and up-to-date. Referrals have been placed accordingly. Immunizations are up-to-date or declined.    Subjective:   Chief Complaint  Patient presents with  . Ear Pain    Pt. stated his right ear hurt and it is swollen. Pt. stated it has been two weeks.    HPI  He feels he was bitten by an insect on his right ear (upper helix) 2 weeks ago while he was outside. He has been taking benadryl which he states has provided some relief of his ear pain and swelling. He denies any difficulty hearing, purulent drainage or erythema.   Essential Hypertension Blood pressure is well controlled. He endorses medication compliance taking amlodipine 10 mg, losartan 50 mg daily. Denies chest pain, shortness of breath, palpitations, lightheadedness, dizziness, headaches or BLE edema. He does not monitor his blood pressure at home.  BP Readings from Last 3 Encounters:  04/29/18 115/78  02/18/18 (!) 147/105  02/10/18 (!) 150/83    Review of Systems  Constitutional: Negative for fever, malaise/fatigue and weight loss.  HENT: Positive for ear discharge and ear pain. Negative for  congestion, hearing loss, nosebleeds, sinus pain, sore throat and tinnitus.   Eyes: Negative.  Negative for blurred vision, double vision and photophobia.  Respiratory: Negative.  Negative for cough, shortness of breath and stridor.   Cardiovascular: Negative.  Negative for chest pain, palpitations and leg swelling.  Gastrointestinal: Negative.  Negative for heartburn, nausea and vomiting.  Musculoskeletal: Negative.  Negative for myalgias.  Neurological: Negative.  Negative for dizziness, focal weakness, seizures and headaches.  Psychiatric/Behavioral: Negative.  Negative for suicidal ideas.    Past Medical History:  Diagnosis Date  . GERD (gastroesophageal reflux disease)   . Hypertension     Past Surgical History:  Procedure Laterality Date  . INCISION AND DRAINAGE PERIRECTAL ABSCESS N/A 01/28/2018   Procedure: IRRIGATION AND DEBRIDEMENT PERIRECTAL ABSCESS;  Surgeon: Sheliah Hatch De Blanch, MD;  Location: [facility];  Service: General;  Laterality: N/A;  . NO PAST SURGERIES      Family History  Problem Relation Age of Onset  . Diabetes Mother   . Hypertension Mother   . Hypertension Father   . Diabetes Sister   . Hypertension Sister   . Hypertension Brother   . Hypertension Sister     Social History Reviewed with no changes to be made today.   Outpatient Medications Prior to Visit  Medication Sig Dispense Refill  . amoxicillin-clavulanate (AUGMENTIN) 875-125 MG tablet Take 1 tablet by mouth 2 (two) times daily. 14 tablet 0  . Esomeprazole Magnesium (NEXIUM PO) Take by mouth.    Marland Kitchen  HYDROcodone-acetaminophen (NORCO) 5-325 MG tablet Take 1 tablet by mouth every 4 (four) hours as needed for moderate pain. 10 tablet 0  . ibuprofen (ADVIL,MOTRIN) 800 MG tablet Take 1 tablet (800 mg total) by mouth every 8 (eight) hours as needed for up to 30 doses. 30 tablet 0  . omeprazole (PRILOSEC) 20 MG capsule TAKE 1 CAPSULE BY MOUTH DAILY. 90 capsule 0  . amLODipine (NORVASC) 10 MG tablet Take  1 tablet (10 mg total) by mouth daily. 90 tablet 1  . nicotine (NICODERM CQ - DOSED IN MG/24 HOURS) 14 mg/24hr patch Place 1 patch (14 mg total) onto the skin daily. (Patient not taking: Reported on 02/18/2018) 36 patch 0  . losartan (COZAAR) 100 MG tablet Take 0.5 tablets (50 mg total) by mouth daily. 30 tablet 2   No facility-administered medications prior to visit.     No Known Allergies     Objective:    There were no vitals taken for this visit. Wt Readings from Last 3 Encounters:  02/18/18 111 lb 12.8 oz (50.7 kg)  01/28/18 130 lb (59 kg)  01/28/18 130 lb (59 kg)          Patient has been counseled extensively about nutrition and exercise as well as the importance of adherence with medications and regular follow-up. The patient was given clear instructions to go to ER or return to medical center if symptoms don't improve, worsen or new problems develop. The patient verbalized understanding.   Follow-up: Return in about 3 months (around 09/01/2018).   Claiborne Rigg, FNP-BC [facility] [place], [place] [phone]   06/01/2018, 3:59 PM

## 2018-06-08 MED FILL — AMLODIPINE BESYLATE 10 MG T: 10 | 30 days supply | Qty: 30 | Fill #0

## 2018-06-08 MED FILL — MUPIROCIN 2% OINTMENT: 2 | 7 days supply | Qty: 22 | Fill #0

## 2018-06-18 ENCOUNTER — Ambulatory Visit: Payer: PRIVATE HEALTH INSURANCE

## 2018-06-30 MED FILL — LOSARTAN POTASSIUM 100 MG T: 100 | 30 days supply | Qty: 15 | Fill #3

## 2018-08-03 ENCOUNTER — Other Ambulatory Visit: Payer: Self-pay | Admitting: Nurse Practitioner

## 2018-08-03 DIAGNOSIS — I10 Essential (primary) hypertension: Secondary | ICD-10-CM

## 2018-08-03 MED FILL — AMLODIPINE BESYLATE 10 MG T: 10 | 30 days supply | Qty: 30 | Fill #1

## 2018-08-04 MED FILL — LOSARTAN POTASSIUM 100 MG T: 100 | 30 days supply | Qty: 15 | Fill #0

## 2018-08-07 ENCOUNTER — Telehealth: Payer: Self-pay | Admitting: Nurse Practitioner

## 2018-08-07 NOTE — Telephone Encounter (Signed)
New Message   1) Medication(s) Requested (by name): amLODipine (NORVASC) 10 MG tablet and losartan (COZAAR) 100 MG tablet  2) Pharmacy of Choice: [facility]   3) Special Requests:   Approved medications will be sent to the pharmacy, we will reach out if there is an issue.  Requests made after 3pm may not be addressed until the following business day!  If a patient is unsure of the name of the medication(s) please note and ask patient to call back when they are able to provide all info, do not send to responsible party until all information is available!

## 2018-08-10 NOTE — Telephone Encounter (Signed)
RX's are filled and ready for pickup at the pharmacy.

## 2018-09-07 ENCOUNTER — Encounter: Payer: Self-pay | Admitting: Nurse Practitioner

## 2018-09-07 ENCOUNTER — Ambulatory Visit: Payer: PRIVATE HEALTH INSURANCE | Attending: Nurse Practitioner | Admitting: Nurse Practitioner

## 2018-09-07 ENCOUNTER — Other Ambulatory Visit: Payer: Self-pay

## 2018-09-07 VITALS — BP 137/91 | HR 96 | Temp 98.5°F | Ht 68.0 in | Wt 109.0 lb

## 2018-09-07 DIAGNOSIS — Z13220 Encounter for screening for lipoid disorders: Secondary | ICD-10-CM

## 2018-09-07 DIAGNOSIS — Z79899 Other long term (current) drug therapy: Secondary | ICD-10-CM

## 2018-09-07 DIAGNOSIS — I10 Essential (primary) hypertension: Secondary | ICD-10-CM

## 2018-09-07 DIAGNOSIS — Z114 Encounter for screening for human immunodeficiency virus [HIV]: Secondary | ICD-10-CM

## 2018-09-07 DIAGNOSIS — D72829 Elevated white blood cell count, unspecified: Secondary | ICD-10-CM

## 2018-09-07 DIAGNOSIS — Z122 Encounter for screening for malignant neoplasm of respiratory organs: Secondary | ICD-10-CM

## 2018-09-07 DIAGNOSIS — Z1211 Encounter for screening for malignant neoplasm of colon: Secondary | ICD-10-CM

## 2018-09-07 MED ORDER — LOSARTAN POTASSIUM 100 MG PO TABS: 100.0000 mg | ORAL_TABLET | Freq: Every day | ORAL | 0 refills | Status: DC

## 2018-09-07 NOTE — Progress Notes (Signed)
Assessment & Plan:  Erik Clay was seen today for follow-up.  Diagnoses and all orders for this visit:  Essential hypertension -     CMP14+EGFR -     Lipid panel -     losartan (COZAAR) 100 MG tablet; Take 1 tablet (100 mg total) by mouth daily. Continue all antihypertensives as prescribed.  Remember to bring in your blood pressure log with you for your follow up appointment.  DASH/Mediterranean Diets are healthier choices for HTN.   Screening for cholesterol level -     Lipid panel  On angiotensin receptor blockers (ARB) -     CMP14+EGFR  Leukocytosis, unspecified type -     CBC  Encounter for screening for HIV -     HIV antibody (with reflex)  Colon cancer screening -     Ambulatory referral to Gastroenterology -     Cancel: Ambulatory referral to General Surgery  Encounter for screening for malignant neoplasm of respiratory organs -     CT CHEST LUNG CA SCREEN LOW DOSE W/O CM; Future  Encounter for screening for lung cancer -     CT CHEST LUNG CA SCREEN LOW DOSE W/O CM; Future Erik Clay was counseled on the dangers of tobacco use, and was advised to quit. Reviewed strategies to maximize success, including removing cigarettes and smoking materials from environment, stress management and support of family/friends as well as pharmacological alternatives including: Wellbutrin, Chantix, Nicotine patch, Nicotine gum or lozenges. Smoking cessation support: smoking cessation hotline: 1-800-QUIT-NOW.  Smoking cessation classes are also available through [facility]. Call [phone] or visit our website at [email or web address]   A total of 3 minutes was spent on counseling for smoking cessation and Erik Clay is not ready to quit.    Patient has been counseled on age-appropriate routine health concerns for screening and prevention. These are reviewed and up-to-date. Referrals have been placed accordingly. Immunizations are up-to-date or declined.      Subjective:   Chief Complaint  Patient presents with   Follow-up    Pt. is here to follow up on Blood pressure.    HPI Erik Clay 58 y.o. male presents to office today for HTN.   has a past medical history of GERD (gastroesophageal reflux disease), Hypertension, and Perirectal abscess. He has a history of re occurring perirectal abscesses. Smoker. S/P I&D with placement of drain and requiring abx. Last surgical procedure 01-2018. He was seen again in the ED on 04-29-2018 for the same issue and received abx. He currently denies any symptoms of abscess today.    Has drastically cut back on his smoking. Only smoking a few cigarettes per day. States he does not smoke all day long while he is at work.     Essential hypertension Chronic and not well controlled on amlodipine 10 mg daily and losartan 50 mg daily. Will increase losartan to 100 mg daily. Does not monitor his blood pressure at home. Denies chest pain, shortness of breath, palpitations, lightheadedness, dizziness, headaches or BLE edema.  BP Readings from Last 3 Encounters:  09/07/18 (!) 137/91  04/29/18 115/78  02/18/18 (!) 147/105    Review of Systems  Constitutional: Positive for weight loss. Negative for fever and malaise/fatigue.  HENT: Negative.  Negative for nosebleeds.   Eyes: Negative for blurred vision, double vision and photophobia.       Strabismus  Respiratory: Negative.  Negative for cough and shortness of breath.   Cardiovascular: Negative.  Negative for chest pain, palpitations  and leg swelling.  Gastrointestinal: Positive for heartburn. Negative for nausea and vomiting.  Musculoskeletal: Negative.  Negative for myalgias.  Neurological: Negative.  Negative for dizziness, focal weakness, seizures and headaches.  Psychiatric/Behavioral: Negative.  Negative for suicidal ideas.    Past Medical History:  Diagnosis Date   GERD (gastroesophageal reflux disease)    Hypertension    Perirectal abscess      Past Surgical History:  Procedure Laterality Date   INCISION AND DRAINAGE PERIRECTAL ABSCESS N/A 01/28/2018   Procedure: IRRIGATION AND DEBRIDEMENT PERIRECTAL ABSCESS;  Surgeon: Kieth Brightly, Arta Bruce, MD;  Location: [facility];  Service: General;  Laterality: N/A;   NO PAST SURGERIES      Family History  Problem Relation Age of Onset   Diabetes Mother    Hypertension Mother    Hypertension Father    Diabetes Sister    Hypertension Sister    Hypertension Brother    Hypertension Sister     Social History Reviewed with no changes to be made today.   Outpatient Medications Prior to Visit  Medication Sig Dispense Refill   amLODipine (NORVASC) 10 MG tablet Take 1 tablet (10 mg total) by mouth daily. 90 tablet 0   Esomeprazole Magnesium (NEXIUM PO) Take by mouth.     ibuprofen (ADVIL,MOTRIN) 800 MG tablet Take 1 tablet (800 mg total) by mouth every 8 (eight) hours as needed for up to 30 doses. 30 tablet 0   mupirocin ointment (BACTROBAN) 2 % One application to affected area 2 (two) times per day. 30 g 0   omeprazole (PRILOSEC) 20 MG capsule TAKE 1 CAPSULE BY MOUTH DAILY. 90 capsule 0   amoxicillin-clavulanate (AUGMENTIN) 875-125 MG tablet Take 1 tablet by mouth 2 (two) times daily. 14 tablet 0   losartan (COZAAR) 100 MG tablet TAKE 1/2 TABLET BY MOUTH DAILY. 30 tablet 2   nicotine (NICODERM CQ - DOSED IN MG/24 HOURS) 14 mg/24hr patch Place 1 patch (14 mg total) onto the skin daily. (Patient not taking: Reported on 02/18/2018) 36 patch 0   HYDROcodone-acetaminophen (NORCO) 5-325 MG tablet Take 1 tablet by mouth every 4 (four) hours as needed for moderate pain. (Patient not taking: Reported on 09/07/2018) 10 tablet 0   No facility-administered medications prior to visit.     No Known Allergies     Objective:    BP (!) 137/91 (BP Location: Right Arm, Patient Position: Sitting, Cuff Size: Normal)    Pulse 96    Temp 98.5 F (36.9 C) (Oral)    Ht '5\' 8"'  (1.727 m)    Wt  109 lb (49.4 kg)    SpO2 97%    BMI 16.57 kg/m  Wt Readings from Last 3 Encounters:  09/07/18 109 lb (49.4 kg)  02/18/18 111 lb 12.8 oz (50.7 kg)  01/28/18 130 lb (59 kg)    Physical Exam Vitals signs and nursing note reviewed.  Constitutional:      Appearance: He is well-developed.  HENT:     Head: Normocephalic and atraumatic.  Eyes:     Extraocular Movements:     Right eye: Abnormal extraocular motion present.     Left eye: Abnormal extraocular motion present.  Neck:     Musculoskeletal: Normal range of motion.  Cardiovascular:     Rate and Rhythm: Normal rate and regular rhythm.     Heart sounds: Normal heart sounds. No murmur. No friction rub. No gallop.   Pulmonary:     Effort: Pulmonary effort is normal. No tachypnea or  respiratory distress.     Breath sounds: Normal breath sounds. No decreased breath sounds, wheezing, rhonchi or rales.  Chest:     Chest wall: No tenderness.  Abdominal:     General: Bowel sounds are normal.     Palpations: Abdomen is soft.  Musculoskeletal: Normal range of motion.  Skin:    General: Skin is warm and dry.  Neurological:     Mental Status: He is alert and oriented to person, place, and time.     Coordination: Coordination normal.  Psychiatric:        Behavior: Behavior normal. Behavior is cooperative.        Thought Content: Thought content normal.        Judgment: Judgment normal.          Patient has been counseled extensively about nutrition and exercise as well as the importance of adherence with medications and regular follow-up. The patient was given clear instructions to go to ER or return to medical center if symptoms don't improve, worsen or new problems develop. The patient verbalized understanding.   Follow-up: Return in about 2 weeks (around 09/21/2018) for BP recheck with LUKE.   Gildardo Pounds, FNP-BC [facility] [place], [place]   09/07/2018, 9:02 PM

## 2018-09-08 LAB — LIPID PANEL
Chol/HDL Ratio: 2.5 ratio (ref 0.0–5.0)
Cholesterol, Total: 164 mg/dL (ref 100–199)
HDL: 65 mg/dL (ref >39)
LDL Calculated: 82 mg/dL (ref 0–99)
Triglycerides: 86 mg/dL (ref 0–149)
VLDL Cholesterol Cal: 17 mg/dL (ref 5–40)

## 2018-09-08 LAB — CMP14+EGFR
ALT: 6 IU/L (ref 0–44)
AST: 14 IU/L (ref 0–40)
Albumin/Globulin Ratio: 1.3 (ref 1.2–2.2)
Albumin: 4.2 g/dL (ref 3.8–4.9)
Alkaline Phosphatase: 82 IU/L (ref 39–117)
BUN/Creatinine Ratio: 7 — ABNORMAL LOW (ref 9–20)
BUN: 9 mg/dL (ref 6–24)
Bilirubin Total: 0.6 mg/dL (ref 0.0–1.2)
CO2: 22 mmol/L (ref 20–29)
Calcium: 9.4 mg/dL (ref 8.7–10.2)
Chloride: 101 mmol/L (ref 96–106)
Creatinine, Ser: 1.25 mg/dL (ref 0.76–1.27)
GFR calc Af Amer: 74 mL/min/{1.73_m2} (ref >59)
GFR calc non Af Amer: 64 mL/min/{1.73_m2} (ref >59)
Globulin, Total: 3.2 g/dL (ref 1.5–4.5)
Glucose: 100 mg/dL — ABNORMAL HIGH (ref 65–99)
Potassium: 4.7 mmol/L (ref 3.5–5.2)
Sodium: 137 mmol/L (ref 134–144)
Total Protein: 7.4 g/dL (ref 6.0–8.5)

## 2018-09-08 LAB — CBC
Hematocrit: 37.6 % (ref 37.5–51.0)
Hemoglobin: 11.3 g/dL — ABNORMAL LOW (ref 13.0–17.7)
MCH: 26.1 pg — ABNORMAL LOW (ref 26.6–33.0)
MCHC: 30.1 g/dL — ABNORMAL LOW (ref 31.5–35.7)
MCV: 87 fL (ref 79–97)
Platelets: 323 10*3/uL (ref 150–450)
RBC: 4.33 x10E6/uL (ref 4.14–5.80)
RDW: 16.2 % — ABNORMAL HIGH (ref 11.6–15.4)
WBC: 8.7 10*3/uL (ref 3.4–10.8)

## 2018-09-08 LAB — HIV ANTIBODY (ROUTINE TESTING W REFLEX): HIV Screen 4th Generation wRfx: NONREACTIVE

## 2018-09-08 MED FILL — LOSARTAN POTASSIUM 100 MG T: 100 | 30 days supply | Qty: 30 | Fill #0

## 2018-09-10 ENCOUNTER — Telehealth: Payer: Self-pay

## 2018-09-10 NOTE — Telephone Encounter (Signed)
-----   Message from Gildardo Pounds, NP sent at 09/08/2018 10:09 PM EDT ----- Labs show anemia. This will be evaluated more with Gastroenterology. If your colonoscopy is normal I will need to send you to a blood specialist for your anemia.

## 2018-09-10 NOTE — Telephone Encounter (Signed)
CMA attempt to reach patient to inform on lab results and referral.  No answer and unable to leave a VM due to no mailbox has been set up.

## 2018-09-10 NOTE — Telephone Encounter (Signed)
-----   Message from Gildardo Pounds, NP sent at 09/08/2018 10:10 PM EDT ----- HIV is negative. Cholesterol levels are normal as well as liver and kidney function.

## 2018-09-21 ENCOUNTER — Other Ambulatory Visit: Payer: Self-pay

## 2018-09-21 ENCOUNTER — Ambulatory Visit (HOSPITAL_BASED_OUTPATIENT_CLINIC_OR_DEPARTMENT_OTHER): Payer: PRIVATE HEALTH INSURANCE | Admitting: Pharmacist

## 2018-09-21 ENCOUNTER — Encounter: Payer: Self-pay | Admitting: Pharmacist

## 2018-09-21 VITALS — BP 101/69 | HR 86

## 2018-09-21 DIAGNOSIS — I10 Essential (primary) hypertension: Secondary | ICD-10-CM | POA: Diagnosis not present

## 2018-09-21 NOTE — Progress Notes (Signed)
   S:    PCP: Zelda  Patient arrives in good spirits. Presents to the clinic for BP check. Pt was referred by and last seen by Zelda on 09/07/18. BP was above goal - Zelda increased losartan to 100 mg daily.   Patient reports adherence with medications.  Patient denies chest pains, dyspnea, HA or blurred vision. No BLE edema.   Current BP Medications include:  Amlodipine 10 mg daily, losartan 100 mg daily  Dietary habits include:  - limits salt - uses hot sauce daily - denies drinking caffeine  Exercise habits include:  - none outside of work - walks daily Family / Social history:  - HTN (mother, father, sister (x2), brother) - Tobacco: current smoker; only smokes 2-3 cigarettes a day - Alcohol: admits to drinking daily; 1-2 drinks   O:  L arm after 5 minutes rest: 102/72 R arm: 101/69  Home BP readings: not checking  Last 3 Office BP readings: BP Readings from Last 3 Encounters:  09/21/18 101/69  09/07/18 (!) 137/91  04/29/18 115/78   BMET    Component Value Date/Time   NA 137 09/07/2018 1608   K 4.7 09/07/2018 1608   CL 101 09/07/2018 1608   CO2 22 09/07/2018 1608   GLUCOSE 100 (H) 09/07/2018 1608   GLUCOSE 101 (H) 01/28/2018 1158   BUN 9 09/07/2018 1608   CREATININE 1.25 09/07/2018 1608   CALCIUM 9.4 09/07/2018 1608   GFRNONAA 64 09/07/2018 1608   GFRAA 74 09/07/2018 1608   Renal function: Estimated Creatinine Clearance: 46.1 mL/min (by C-G formula based on SCr of 1.25 mg/dL).  Clinical ASCVD: No  The 10-year ASCVD risk score Mikey Bussing DC Jr., et al., 2013) is: 11%   Values used to calculate the score:     Age: 58 years     Sex: Male     Is Non-Hispanic African American: Yes     Diabetic: No     Tobacco smoker: Yes     Systolic Blood Pressure: 761 mmHg     Is BP treated: Yes     HDL Cholesterol: 65 mg/dL     Total Cholesterol: 164 mg/dL  A/P: Hypertension longstanding currently at goal on current medications. BP Goal <130/80 mmHg. Patient is adherent  with current medications.  -Continued current regimen.  -Counseled on lifestyle modifications for blood pressure control including reduced dietary sodium, increased exercise, adequate sleep  ASCVD risk - primary prevention in a patient with no diabetes. Last LDL < 100. ASCVD risk score dropped from ~18 to 11% with today's BP. Strongly encouraged compliance to medications. Commended current progress and encouraged smoking cessation. Pt reports willingness to try and stop and smoking. He will talk to his PCP.  - Encouraged medication compliance - Pt may benefit from statin in the future.   HM:  - PNA (Pneumoax-23) recommended with smoking hx.  - Will address at f/u appt.   Results reviewed and written information provided. Total time in face-to-face counseling 15 minutes.   F/U Clinic Visit in 1 month.    Benard Halsted, PharmD, [place] [phone]

## 2018-09-21 NOTE — Patient Instructions (Signed)

## 2018-09-23 ENCOUNTER — Encounter (HOSPITAL_COMMUNITY): Payer: Self-pay

## 2018-09-23 ENCOUNTER — Encounter (HOSPITAL_COMMUNITY): Admission: EM | Disposition: A | Discharge status: Home / Self Care | Payer: Self-pay | Source: Home / Self Care

## 2018-09-23 ENCOUNTER — Other Ambulatory Visit: Payer: Self-pay

## 2018-09-23 ENCOUNTER — Ambulatory Visit (HOSPITAL_COMMUNITY)
Admission: EM | Admit: 2018-09-23 | Discharge: 2018-09-23 | Disposition: A | Discharge status: Home / Self Care | Payer: PRIVATE HEALTH INSURANCE | Source: Home / Self Care

## 2018-09-23 ENCOUNTER — Encounter (HOSPITAL_COMMUNITY): Payer: Self-pay | Admitting: Emergency Medicine

## 2018-09-23 ENCOUNTER — Observation Stay (HOSPITAL_COMMUNITY): Payer: PRIVATE HEALTH INSURANCE | Admitting: Certified Registered Nurse Anesthetist

## 2018-09-23 ENCOUNTER — Inpatient Hospital Stay (HOSPITAL_COMMUNITY)
Admission: EM | Admit: 2018-09-23 | Discharge: 2018-09-26 | DRG: 394 | Disposition: A | Discharge status: Home Health | Payer: PRIVATE HEALTH INSURANCE | Attending: Surgery | Admitting: Surgery

## 2018-09-23 ENCOUNTER — Emergency Department (HOSPITAL_COMMUNITY): Payer: PRIVATE HEALTH INSURANCE

## 2018-09-23 DIAGNOSIS — L0231 Cutaneous abscess of buttock: Secondary | ICD-10-CM | POA: Diagnosis present

## 2018-09-23 DIAGNOSIS — K611 Rectal abscess: Secondary | ICD-10-CM | POA: Diagnosis present

## 2018-09-23 DIAGNOSIS — K219 Gastro-esophageal reflux disease without esophagitis: Secondary | ICD-10-CM | POA: Diagnosis present

## 2018-09-23 DIAGNOSIS — K612 Anorectal abscess: Principal | ICD-10-CM | POA: Diagnosis present

## 2018-09-23 DIAGNOSIS — R7989 Other specified abnormal findings of blood chemistry: Secondary | ICD-10-CM | POA: Diagnosis present

## 2018-09-23 DIAGNOSIS — Z8249 Family history of ischemic heart disease and other diseases of the circulatory system: Secondary | ICD-10-CM

## 2018-09-23 DIAGNOSIS — Z833 Family history of diabetes mellitus: Secondary | ICD-10-CM

## 2018-09-23 DIAGNOSIS — Z20828 Contact with and (suspected) exposure to other viral communicable diseases: Secondary | ICD-10-CM | POA: Diagnosis present

## 2018-09-23 DIAGNOSIS — Z681 Body mass index (BMI) 19 or less, adult: Secondary | ICD-10-CM

## 2018-09-23 DIAGNOSIS — I10 Essential (primary) hypertension: Secondary | ICD-10-CM | POA: Diagnosis present

## 2018-09-23 DIAGNOSIS — L0291 Cutaneous abscess, unspecified: Secondary | ICD-10-CM

## 2018-09-23 DIAGNOSIS — Z79899 Other long term (current) drug therapy: Secondary | ICD-10-CM

## 2018-09-23 DIAGNOSIS — R636 Underweight: Secondary | ICD-10-CM | POA: Diagnosis present

## 2018-09-23 DIAGNOSIS — K648 Other hemorrhoids: Secondary | ICD-10-CM | POA: Diagnosis present

## 2018-09-23 DIAGNOSIS — F17210 Nicotine dependence, cigarettes, uncomplicated: Secondary | ICD-10-CM | POA: Diagnosis present

## 2018-09-23 HISTORY — PX: INCISION AND DRAINAGE ABSCESS: SHX5864

## 2018-09-23 LAB — CBC WITH DIFFERENTIAL/PLATELET
Abs Immature Granulocytes: 0.12 10*3/uL — ABNORMAL HIGH (ref 0.00–0.07)
Basophils Absolute: 0 10*3/uL (ref 0.0–0.1)
Basophils Relative: 0 %
Eosinophils Absolute: 0.1 10*3/uL (ref 0.0–0.5)
Eosinophils Relative: 1 %
HCT: 39.4 % (ref 39.0–52.0)
Hemoglobin: 12.1 g/dL — ABNORMAL LOW (ref 13.0–17.0)
Immature Granulocytes: 1 %
Lymphocytes Relative: 4 %
Lymphs Abs: 0.5 10*3/uL — ABNORMAL LOW (ref 0.7–4.0)
MCH: 25.5 pg — ABNORMAL LOW (ref 26.0–34.0)
MCHC: 30.7 g/dL (ref 30.0–36.0)
MCV: 83.1 fL (ref 80.0–100.0)
Monocytes Absolute: 1 10*3/uL (ref 0.1–1.0)
Monocytes Relative: 7 %
Neutro Abs: 11.6 10*3/uL — ABNORMAL HIGH (ref 1.7–7.7)
Neutrophils Relative %: 87 %
Platelets: 228 10*3/uL (ref 150–400)
RBC: 4.74 MIL/uL (ref 4.22–5.81)
RDW: 18.3 % — ABNORMAL HIGH (ref 11.5–15.5)
WBC: 13.3 10*3/uL — ABNORMAL HIGH (ref 4.0–10.5)
nRBC: 0 % (ref 0.0–0.2)

## 2018-09-23 LAB — BASIC METABOLIC PANEL
Anion gap: 17 — ABNORMAL HIGH (ref 5–15)
BUN: 19 mg/dL (ref 6–20)
CO2: 16 mmol/L — ABNORMAL LOW (ref 22–32)
Calcium: 9.1 mg/dL (ref 8.9–10.3)
Chloride: 101 mmol/L (ref 98–111)
Creatinine, Ser: 2.33 mg/dL — ABNORMAL HIGH (ref 0.61–1.24)
GFR calc Af Amer: 35 mL/min — ABNORMAL LOW (ref >60)
GFR calc non Af Amer: 30 mL/min — ABNORMAL LOW (ref >60)
Glucose, Bld: 116 mg/dL — ABNORMAL HIGH (ref 70–99)
Potassium: 3.7 mmol/L (ref 3.5–5.1)
Sodium: 134 mmol/L — ABNORMAL LOW (ref 135–145)

## 2018-09-23 LAB — SARS CORONAVIRUS 2 BY RT PCR (HOSPITAL ORDER, PERFORMED IN ~~LOC~~ HOSPITAL LAB): SARS Coronavirus 2: NEGATIVE

## 2018-09-23 SURGERY — INCISION AND DRAINAGE, ABSCESS
Anesthesia: General | Site: Rectum

## 2018-09-23 MED ORDER — LACTATED RINGERS IV SOLN: INTRAVENOUS | Status: DC

## 2018-09-23 MED ORDER — ONDANSETRON 4 MG PO TBDP: 4.0000 mg | ORAL_TABLET | Freq: Four times a day (QID) | ORAL | Status: DC | PRN

## 2018-09-23 MED ORDER — DOCUSATE SODIUM 100 MG PO CAPS
100.0000 mg | ORAL_CAPSULE | Freq: Two times a day (BID) | ORAL | Status: DC
  Administered 2018-09-23 – 2018-09-26 (×6): 100 mg via ORAL
  Filled 2018-09-23 (×6): qty 1

## 2018-09-23 MED ORDER — DIPHENHYDRAMINE HCL 25 MG PO CAPS: 25.0000 mg | ORAL_CAPSULE | Freq: Four times a day (QID) | ORAL | Status: DC | PRN

## 2018-09-23 MED ORDER — LOSARTAN POTASSIUM 50 MG PO TABS
100.0000 mg | ORAL_TABLET | Freq: Every day | ORAL | Status: DC
  Administered 2018-09-23 – 2018-09-25 (×3): 100 mg via ORAL
  Filled 2018-09-23 (×3): qty 2

## 2018-09-23 MED ORDER — ACETAMINOPHEN 500 MG PO TABS
1000.0000 mg | ORAL_TABLET | Freq: Four times a day (QID) | ORAL | Status: DC
  Administered 2018-09-23 – 2018-09-26 (×9): 1000 mg via ORAL
  Filled 2018-09-23 (×9): qty 2

## 2018-09-23 MED ORDER — DEXAMETHASONE SODIUM PHOSPHATE 10 MG/ML IJ SOLN
INTRAMUSCULAR | Status: AC
  Filled 2018-09-23: qty 1

## 2018-09-23 MED ORDER — PIPERACILLIN-TAZOBACTAM 3.375 G IVPB
3.3750 g | Freq: Three times a day (TID) | INTRAVENOUS | Status: DC
  Administered 2018-09-23 – 2018-09-26 (×8): 3.375 g via INTRAVENOUS
  Filled 2018-09-23 (×9): qty 50

## 2018-09-23 MED ORDER — SODIUM CHLORIDE 0.9 % IV SOLN
INTRAVENOUS | Status: DC
  Administered 2018-09-23 – 2018-09-25 (×4): via INTRAVENOUS

## 2018-09-23 MED ORDER — 0.9 % SODIUM CHLORIDE (POUR BTL) OPTIME
TOPICAL | Status: DC | PRN
  Administered 2018-09-23: 1000 mL

## 2018-09-23 MED ORDER — PANTOPRAZOLE SODIUM 40 MG IV SOLR
40.0000 mg | Freq: Every day | INTRAVENOUS | Status: DC
  Administered 2018-09-23: 22:00:00 40 mg via INTRAVENOUS
  Filled 2018-09-23: qty 40

## 2018-09-23 MED ORDER — SODIUM CHLORIDE 0.9 % IV SOLN
INTRAVENOUS | Status: DC
  Administered 2018-09-23: 18:00:00 via INTRAVENOUS

## 2018-09-23 MED ORDER — PROPOFOL 10 MG/ML IV BOLUS
INTRAVENOUS | Status: AC
  Filled 2018-09-23: qty 20

## 2018-09-23 MED ORDER — DEXAMETHASONE SODIUM PHOSPHATE 10 MG/ML IJ SOLN
INTRAMUSCULAR | Status: DC | PRN
  Administered 2018-09-23: 5 mg via INTRAVENOUS

## 2018-09-23 MED ORDER — FENTANYL CITRATE (PF) 250 MCG/5ML IJ SOLN
INTRAMUSCULAR | Status: DC | PRN
  Administered 2018-09-23 (×2): 50 ug via INTRAVENOUS

## 2018-09-23 MED ORDER — FENTANYL CITRATE (PF) 250 MCG/5ML IJ SOLN
INTRAMUSCULAR | Status: AC
  Filled 2018-09-23: qty 5

## 2018-09-23 MED ORDER — PROPOFOL 10 MG/ML IV BOLUS
INTRAVENOUS | Status: DC | PRN
  Administered 2018-09-23: 50 mg via INTRAVENOUS
  Administered 2018-09-23: 150 mg via INTRAVENOUS

## 2018-09-23 MED ORDER — HYDRALAZINE HCL 20 MG/ML IJ SOLN: 10.0000 mg | INTRAMUSCULAR | Status: DC | PRN

## 2018-09-23 MED ORDER — MIDAZOLAM HCL 2 MG/2ML IJ SOLN
INTRAMUSCULAR | Status: DC | PRN
  Administered 2018-09-23: 2 mg via INTRAVENOUS

## 2018-09-23 MED ORDER — AMLODIPINE BESYLATE 10 MG PO TABS
10.0000 mg | ORAL_TABLET | Freq: Every day | ORAL | Status: DC
  Administered 2018-09-23 – 2018-09-26 (×4): 10 mg via ORAL
  Filled 2018-09-23 (×4): qty 1

## 2018-09-23 MED ORDER — PHENYLEPHRINE 40 MCG/ML (10ML) SYRINGE FOR IV PUSH (FOR BLOOD PRESSURE SUPPORT)
PREFILLED_SYRINGE | INTRAVENOUS | Status: AC
  Filled 2018-09-23: qty 10

## 2018-09-23 MED ORDER — PHENYLEPHRINE 40 MCG/ML (10ML) SYRINGE FOR IV PUSH (FOR BLOOD PRESSURE SUPPORT)
PREFILLED_SYRINGE | INTRAVENOUS | Status: DC | PRN
  Administered 2018-09-23: 80 ug via INTRAVENOUS
  Administered 2018-09-23: 160 ug via INTRAVENOUS

## 2018-09-23 MED ORDER — ONDANSETRON HCL 4 MG/2ML IJ SOLN: 4.0000 mg | Freq: Four times a day (QID) | INTRAMUSCULAR | Status: DC | PRN

## 2018-09-23 MED ORDER — LACTATED RINGERS IV SOLN
INTRAVENOUS | Status: DC | PRN
  Administered 2018-09-23: 18:00:00 via INTRAVENOUS

## 2018-09-23 MED ORDER — MIDAZOLAM HCL 2 MG/2ML IJ SOLN
INTRAMUSCULAR | Status: AC
  Filled 2018-09-23: qty 2

## 2018-09-23 MED ORDER — MORPHINE SULFATE (PF) 2 MG/ML IV SOLN
2.0000 mg | INTRAVENOUS | Status: DC | PRN
  Administered 2018-09-24 (×2): 2 mg via INTRAVENOUS
  Filled 2018-09-23 (×2): qty 1

## 2018-09-23 MED ORDER — OXYCODONE HCL 5 MG PO TABS
5.0000 mg | ORAL_TABLET | ORAL | Status: DC | PRN
  Administered 2018-09-24 – 2018-09-25 (×3): 10 mg via ORAL
  Filled 2018-09-23 (×3): qty 2

## 2018-09-23 MED ORDER — ENOXAPARIN SODIUM 30 MG/0.3ML ~~LOC~~ SOLN
30.0000 mg | SUBCUTANEOUS | Status: DC
  Administered 2018-09-24 – 2018-09-26 (×3): 30 mg via SUBCUTANEOUS
  Filled 2018-09-23 (×3): qty 0.3

## 2018-09-23 MED ORDER — ONDANSETRON HCL 4 MG/2ML IJ SOLN
INTRAMUSCULAR | Status: AC
  Filled 2018-09-23: qty 2

## 2018-09-23 MED ORDER — DIPHENHYDRAMINE HCL 50 MG/ML IJ SOLN: 25.0000 mg | Freq: Four times a day (QID) | INTRAMUSCULAR | Status: DC | PRN

## 2018-09-23 MED ORDER — LIDOCAINE 2% (20 MG/ML) 5 ML SYRINGE
INTRAMUSCULAR | Status: DC | PRN
  Administered 2018-09-23: 60 mg via INTRAVENOUS

## 2018-09-23 MED ORDER — PIPERACILLIN-TAZOBACTAM 3.375 G IVPB 30 MIN
3.3750 g | Freq: Once | INTRAVENOUS | Status: AC
  Administered 2018-09-23: 17:00:00 3.375 g via INTRAVENOUS
  Filled 2018-09-23: qty 50

## 2018-09-23 MED ORDER — ONDANSETRON HCL 4 MG/2ML IJ SOLN
INTRAMUSCULAR | Status: DC | PRN
  Administered 2018-09-23: 4 mg via INTRAVENOUS

## 2018-09-23 SURGICAL SUPPLY — 30 items
BNDG GAUZE ELAST 4 BULKY (GAUZE/BANDAGES/DRESSINGS) ×2 IMPLANT
CANISTER SUCT 3000ML PPV (MISCELLANEOUS) ×3 IMPLANT
COVER SURGICAL LIGHT HANDLE (MISCELLANEOUS) ×3 IMPLANT
COVER WAND RF STERILE (DRAPES) ×1 IMPLANT
DRAIN PENROSE 18X1/4 LTX STRL (DRAIN) ×2 IMPLANT
DRAPE LAPAROSCOPIC ABDOMINAL (DRAPES) IMPLANT
DRAPE LAPAROTOMY 100X72 PEDS (DRAPES) IMPLANT
DRSG PAD ABDOMINAL 8X10 ST (GAUZE/BANDAGES/DRESSINGS) ×2 IMPLANT
ELECT REM PT RETURN 9FT ADLT (ELECTROSURGICAL) ×3
ELECTRODE REM PT RTRN 9FT ADLT (ELECTROSURGICAL) ×1 IMPLANT
GAUZE SPONGE 4X4 12PLY STRL (GAUZE/BANDAGES/DRESSINGS) IMPLANT
GAUZE SPONGE 4X4 12PLY STRL LF (GAUZE/BANDAGES/DRESSINGS) ×2 IMPLANT
GLOVE BIO SURGEON STRL SZ7.5 (GLOVE) ×3 IMPLANT
GLOVE INDICATOR 8.0 STRL GRN (GLOVE) ×3 IMPLANT
GOWN STRL REUS W/ TWL LRG LVL3 (GOWN DISPOSABLE) ×1 IMPLANT
GOWN STRL REUS W/ TWL XL LVL3 (GOWN DISPOSABLE) ×1 IMPLANT
GOWN STRL REUS W/TWL LRG LVL3 (GOWN DISPOSABLE) ×3
GOWN STRL REUS W/TWL XL LVL3 (GOWN DISPOSABLE) ×3
KIT BASIN OR (CUSTOM PROCEDURE TRAY) ×3 IMPLANT
KIT TURNOVER KIT B (KITS) ×3 IMPLANT
NS IRRIG 1000ML POUR BTL (IV SOLUTION) ×3 IMPLANT
PACK GENERAL/GYN (CUSTOM PROCEDURE TRAY) ×3 IMPLANT
PAD ARMBOARD 7.5X6 YLW CONV (MISCELLANEOUS) ×3 IMPLANT
PENCIL SMOKE EVACUATOR (MISCELLANEOUS) ×3 IMPLANT
SHEET LAVH (DRAPES) ×2 IMPLANT
SWAB COLLECTION DEVICE MRSA (MISCELLANEOUS) ×2 IMPLANT
SWAB CULTURE ESWAB REG 1ML (MISCELLANEOUS) IMPLANT
SWAB CULTURE LIQUID MINI MALE (MISCELLANEOUS) ×2 IMPLANT
TOWEL GREEN STERILE (TOWEL DISPOSABLE) ×3 IMPLANT
TOWEL GREEN STERILE FF (TOWEL DISPOSABLE) ×3 IMPLANT

## 2018-09-23 NOTE — Transfer of Care (Signed)
Immediate Anesthesia Transfer of Care Note  Patient: Erik Clay  Procedure(s) Performed: INCISION AND DRAINAGE PERIRECTAL ABSCESS (N/A Rectum)  Patient Location: PACU  Anesthesia Type:General  Level of Consciousness: drowsy, patient cooperative and responds to stimulation  Airway & Oxygen Therapy: Patient Spontanous Breathing  Post-op Assessment: Report given to RN and Post -op Vital signs reviewed and stable  Post vital signs: Reviewed and stable  Last Vitals:  Vitals Value Taken Time  BP 131/101 09/23/18 1858  Temp    Pulse    Resp 15 09/23/18 1858  SpO2    Vitals shown include unvalidated device data.  Last Pain:  Vitals:   09/23/18 1143  TempSrc: Oral  PainSc: 10-Worst pain ever         Complications: No apparent anesthesia complications

## 2018-09-23 NOTE — ED Triage Notes (Signed)
Patient presents to Urgent Care with complaints of abscess on his left buttocks since yesterday. Patient reports the abscess is the same that he has had before.

## 2018-09-23 NOTE — ED Provider Notes (Signed)
[facility]    CSN: [id number] Arrival date & time: 09/23/18  [id number]     History   Chief Complaint Chief Complaint  Patient presents with  . Abscess    HPI Erik Clay is a 58 y.o. male.   Patient presents with abscess on his left buttock which he reports started yesterday.  This is recurrent and he has a history of a perirectal abscess requiring surgery.  He denies fever or chills.     The history is provided by the patient.    Past Medical History:  Diagnosis Date  . GERD (gastroesophageal reflux disease)   . Hypertension   . Perirectal abscess     Patient Active Problem List   Diagnosis Date Noted  . Essential hypertension 12/08/2016  . Gastroesophageal reflux disease 12/08/2016    Past Surgical History:  Procedure Laterality Date  . INCISION AND DRAINAGE PERIRECTAL ABSCESS N/A 01/28/2018   Procedure: IRRIGATION AND DEBRIDEMENT PERIRECTAL ABSCESS;  Surgeon: Sheliah HatchKinsinger, De BlanchLuke Aaron, MD;  Location: [facility] OR;  Service: General;  Laterality: N/A;  . NO PAST SURGERIES         Home Medications    Prior to Admission medications   Medication Sig Start Date End Date Taking? Authorizing Provider  amLODipine (NORVASC) 10 MG tablet Take 1 tablet (10 mg total) by mouth daily. 06/01/18   Claiborne RiggFleming, Zelda W, NP  Esomeprazole Magnesium (NEXIUM PO) Take by mouth.    [provider]  ibuprofen (ADVIL,MOTRIN) 800 MG tablet Take 1 tablet (800 mg total) by mouth every 8 (eight) hours as needed for up to 30 doses. 01/28/18   Berna Bueonnor, Chelsea A, MD  losartan (COZAAR) 100 MG tablet Take 1 tablet (100 mg total) by mouth daily. 09/07/18 12/06/18  Claiborne RiggFleming, Zelda W, NP  mupirocin ointment (BACTROBAN) 2 % One application to affected area 2 (two) times per day. 06/01/18   Claiborne RiggFleming, Zelda W, NP  nicotine (NICODERM CQ - DOSED IN MG/24 HOURS) 14 mg/24hr patch Place 1 patch (14 mg total) onto the skin daily. Patient not taking: Reported on 02/18/2018 09/01/17   Claiborne RiggFleming, Zelda W, NP   omeprazole (PRILOSEC) 20 MG capsule TAKE 1 CAPSULE BY MOUTH DAILY. 05/08/18   Claiborne RiggFleming, Zelda W, NP    Family History Family History  Problem Relation Age of Onset  . Diabetes Mother   . Hypertension Mother   . Hypertension Father   . Diabetes Sister   . Hypertension Sister   . Hypertension Brother   . Hypertension Sister     Social History Social History   Tobacco Use  . Smoking status: Current Every Day Smoker    Packs/day: 0.50    Types: Cigarettes  . Smokeless tobacco: Never Used  Substance Use Topics  . Alcohol use: Yes    Comment: 1-2 per day  . Drug use: No     Allergies   Patient has no known allergies.   Review of Systems Review of Systems  Constitutional: Negative for chills and fever.  HENT: Negative for ear pain and sore throat.   Eyes: Negative for pain and visual disturbance.  Respiratory: Negative for cough and shortness of breath.   Cardiovascular: Negative for chest pain and palpitations.  Gastrointestinal: Negative for abdominal pain and vomiting.  Genitourinary: Negative for dysuria and hematuria.  Musculoskeletal: Negative for arthralgias and back pain.  Skin: Positive for wound. Negative for color change and rash.  Neurological: Negative for seizures and syncope.  All other systems reviewed and are negative.  Physical Exam Triage Vital Signs ED Triage Vitals  Enc Vitals Group     BP 09/23/18 1054 (!) 138/93     Pulse Rate 09/23/18 1054 (!) 122     Resp 09/23/18 1054 17     Temp 09/23/18 1054 98.9 F (37.2 C)     Temp Source 09/23/18 1054 Oral     SpO2 09/23/18 1054 100 %     Weight --      Height --      Head Circumference --      Peak Flow --      Pain Score 09/23/18 1052 7     Pain Loc --      Pain Edu? --      Excl. in [place]? --    No data found.  Updated Vital Signs BP (!) 138/93 (BP Location: Left Arm)   Pulse (!) 122   Temp 98.9 F (37.2 C) (Oral)   Resp 17   SpO2 100%   Visual Acuity Right Eye Distance:   Left  Eye Distance:   Bilateral Distance:    Right Eye Near:   Left Eye Near:    Bilateral Near:     Physical Exam Vitals signs and nursing note reviewed.  Constitutional:      Appearance: He is well-developed.  HENT:     Head: Normocephalic and atraumatic.  Eyes:     Conjunctiva/sclera: Conjunctivae normal.  Neck:     Musculoskeletal: Neck supple.  Cardiovascular:     Rate and Rhythm: Normal rate and regular rhythm.     Heart sounds: No murmur.  Pulmonary:     Effort: Pulmonary effort is normal. No respiratory distress.     Breath sounds: Normal breath sounds.  Abdominal:     Palpations: Abdomen is soft.     Tenderness: There is no abdominal tenderness. There is no guarding or rebound.  Skin:    General: Skin is warm and dry.     Comments: Abscess on left buttock: 13 cm x 8 cm, fluctuant, tender.  Neurological:     Mental Status: He is alert.      UC Treatments / Results  Labs (all labs ordered are listed, but only abnormal results are displayed) Labs Reviewed - No data to display  EKG   Radiology No results found.  Procedures Procedures (including critical care time)  Medications Ordered in UC Medications - No data to display  Initial Impression / Assessment and Plan / UC Course  I have reviewed the triage vital signs and the nursing notes.  Pertinent labs & imaging results that were available during my care of the patient were reviewed by me and considered in my medical decision making (see chart for details).   Abscess on left buttock.  Patient is tachycardic.  History of perirectal abscess requiring surgery in 01/2019.  Sending patient to ER for evaluation.     Final Clinical Impressions(s) / UC Diagnoses   Final diagnoses:  Abscess     Discharge Instructions     Go to ER for evaluation of large abscess on left buttock.        ED Prescriptions    None     Controlled Substance Prescriptions [place] Controlled Substance Registry consulted? Not  Applicable   Sharion Balloon, NP 09/23/18 [phone]

## 2018-09-23 NOTE — Anesthesia Preprocedure Evaluation (Signed)
Anesthesia Evaluation  Patient identified by MRN, date of birth, ID band Patient awake    Reviewed: Allergy & Precautions, H&P , NPO status , Patient's Chart, lab work & pertinent test results  Airway Mallampati: II   Neck ROM: full    Dental   Pulmonary Current Smoker,    breath sounds clear to auscultation       Cardiovascular hypertension,  Rhythm:regular Rate:Normal     Neuro/Psych    GI/Hepatic GERD  ,  Endo/Other    Renal/GU Renal diseaseCr 2.33     Musculoskeletal   Abdominal   Peds  Hematology   Anesthesia Other Findings   Reproductive/Obstetrics                             Anesthesia Physical Anesthesia Plan  ASA: II  Anesthesia Plan: General   Post-op Pain Management:    Induction: Intravenous  PONV Risk Score and Plan: 1 and Ondansetron, Dexamethasone, Midazolam and Treatment may vary due to age or medical condition  Airway Management Planned: LMA  Additional Equipment:   Intra-op Plan:   Post-operative Plan:   Informed Consent: I have reviewed the patients History and Physical, chart, labs and discussed the procedure including the risks, benefits and alternatives for the proposed anesthesia with the patient or authorized representative who has indicated his/her understanding and acceptance.       Plan Discussed with: CRNA, Anesthesiologist and Surgeon  Anesthesia Plan Comments:         Anesthesia Quick Evaluation

## 2018-09-23 NOTE — ED Provider Notes (Signed)
[name] Provider Note   CSN: [id number] Arrival date & time: 09/23/18  1130   History   Chief Complaint Chief Complaint  Patient presents with  . Abscess    HPI Erik Clay is a 58 y.o. male.     HPI    58 year old male presents today with complaints of rectal abscess.  He has a significant past medical history of the same.  He notes 3 days ago he developed pain to the left lateral rectum and gluteus.  This has continued to persist with worsening.  He notes no bloody bowel movements, he does note pain with bowel movement.  He denies any fever but notes he feels unwell.  Denies any abdominal pain.  He notes solid food at approximately 3 AM this morning and liquids approximately 2 hours prior to arrival at 10 AM.   Past Medical History:  Diagnosis Date  . GERD (gastroesophageal reflux disease)   . Hypertension   . Perirectal abscess     Patient Active Problem List   Diagnosis Date Noted  . Perirectal abscess 09/23/2018  . Essential hypertension 12/08/2016  . Gastroesophageal reflux disease 12/08/2016    Past Surgical History:  Procedure Laterality Date  . INCISION AND DRAINAGE PERIRECTAL ABSCESS N/A 01/28/2018   Procedure: IRRIGATION AND DEBRIDEMENT PERIRECTAL ABSCESS;  Surgeon: Sheliah HatchKinsinger, De BlanchLuke Aaron, MD;  Location: [facility];  Service: General;  Laterality: N/A;  . NO PAST SURGERIES          Home Medications    Prior to Admission medications   Medication Sig Start Date End Date Taking? Authorizing Provider  amLODipine (NORVASC) 10 MG tablet Take 1 tablet (10 mg total) by mouth daily. 06/01/18  Yes Claiborne RiggFleming, Zelda W, NP  ibuprofen (ADVIL,MOTRIN) 800 MG tablet Take 1 tablet (800 mg total) by mouth every 8 (eight) hours as needed for up to 30 doses. 01/28/18  Yes Berna Bueonnor, Chelsea A, MD  losartan (COZAAR) 100 MG tablet Take 1 tablet (100 mg total) by mouth daily. 09/07/18 12/06/18 Yes Claiborne RiggFleming, Zelda W, NP  mupirocin ointment  (BACTROBAN) 2 % One application to affected area 2 (two) times per day. Patient taking differently: Apply 1 application topically 2 (two) times daily as needed (rash).  06/01/18  Yes Claiborne RiggFleming, Zelda W, NP  omeprazole (PRILOSEC) 20 MG capsule TAKE 1 CAPSULE BY MOUTH DAILY. Patient taking differently: Take 20 mg by mouth daily.  05/08/18  Yes Claiborne RiggFleming, Zelda W, NP  nicotine (NICODERM CQ - DOSED IN MG/24 HOURS) 14 mg/24hr patch Place 1 patch (14 mg total) onto the skin daily. Patient not taking: Reported on 02/18/2018 09/01/17   Claiborne RiggFleming, Zelda W, NP    Family History Family History  Problem Relation Age of Onset  . Diabetes Mother   . Hypertension Mother   . Hypertension Father   . Diabetes Sister   . Hypertension Sister   . Hypertension Brother   . Hypertension Sister     Social History Social History   Tobacco Use  . Smoking status: Current Every Day Smoker    Packs/day: 0.50    Types: Cigarettes  . Smokeless tobacco: Never Used  Substance Use Topics  . Alcohol use: Yes    Comment: 1-2 per day  . Drug use: No     Allergies   Patient has no known allergies.   Review of Systems Review of Systems  All other systems reviewed and are negative.  Physical Exam Updated Vital Signs BP 126/89 (BP Location:  Right Arm)   Pulse 85   Temp 97.7 F (36.5 C) (Oral)   Resp 14   Ht 5\' 8"  (1.727 m)   Wt 49.4 kg   SpO2 100%   BMI 16.57 kg/m   Physical Exam Vitals signs and nursing note reviewed.  Constitutional:      Appearance: He is well-developed.  HENT:     Head: Normocephalic and atraumatic.  Eyes:     General: No scleral icterus.       Right eye: No discharge.        Left eye: No discharge.     Conjunctiva/sclera: Conjunctivae normal.     Pupils: Pupils are equal, round, and reactive to light.  Neck:     Musculoskeletal: Normal range of motion.     Vascular: No JVD.     Trachea: No tracheal deviation.  Pulmonary:     Effort: Pulmonary effort is normal.     Breath  sounds: No stridor.  Abdominal:     General: Abdomen is flat. There is no distension.     Tenderness: There is no abdominal tenderness.  Genitourinary:    Comments: Large area of induration with central fluctuance noted to the left perirectal region approximately 10 cm x 8cm induration noted up to the rectal wall unable to perform rectal exam secondary to discomfort Neurological:     Mental Status: He is alert and oriented to person, place, and time.     Coordination: Coordination normal.  Psychiatric:        Behavior: Behavior normal.        Thought Content: Thought content normal.        Judgment: Judgment normal.      ED Treatments / Results  Labs (all labs ordered are listed, but only abnormal results are displayed) Labs Reviewed  CBC WITH DIFFERENTIAL/PLATELET - Abnormal; Notable for the following components:      Result Value   WBC 13.3 (*)    Hemoglobin 12.1 (*)    MCH 25.5 (*)    RDW 18.3 (*)    Neutro Abs 11.6 (*)    Lymphs Abs 0.5 (*)    Abs Immature Granulocytes 0.12 (*)    All other components within normal limits  BASIC METABOLIC PANEL - Abnormal; Notable for the following components:   Sodium 134 (*)    CO2 16 (*)    Glucose, Bld 116 (*)    Creatinine, Ser 2.33 (*)    GFR calc non Af Amer 30 (*)    GFR calc Af Amer 35 (*)    Anion gap 17 (*)    All other components within normal limits  SARS CORONAVIRUS 2 (HOSPITAL ORDER, PERFORMED IN [facility])  FUNGUS CULTURE WITH STAIN  AEROBIC/ANAEROBIC CULTURE (SURGICAL/DEEP WOUND)    EKG None  Radiology Ct Pelvis Wo Contrast  Result Date: 09/23/2018 CLINICAL DATA:  Left buttock and peroneal abscess. EXAM: CT PELVIS WITHOUT CONTRAST TECHNIQUE: Multidetector CT imaging of the pelvis was performed following the standard protocol without intravenous contrast. COMPARISON:  CT scan 01/28/2018 FINDINGS: Urinary Tract: The bladder is grossly normal. No bladder calculi or mass. A right renal calculus is  noted. No hydroureteronephrosis. Bowel: The visualized small bowel and colon are grossly normal without oral contrast. Colonic diverticulosis is noted. Vascular/Lymphatic: Significant age advanced atherosclerotic calcifications involving the lower aorta and iliac and common femoral arteries. No inguinal or pelvic adenopathy. Scattered lymph nodes are noted. Reproductive: The prostate gland and seminal vesicles are unremarkable.  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 This measures a maximum of 8.3 x 5.0 cm. MRI without and with contrast may be helpful for further evaluation. There appears to be some prolapse of the rectal mucosa or hemorrhoids. Musculoskeletal: No significant bony findings. 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 IMPRESSION: 1. 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 MRI pelvis without and with contrast may be helpful for more definitive evaluation and pre-surgical planning. 2. Age advanced vascular calcifications. 3. Right renal calculus. 4. No intrapelvic abscess or adenopathy. Electronically Signed   By: Marijo Sanes M.D.   On: 09/23/2018 15:21    Procedures Procedures (including critical care time)  Medications Ordered in ED Medications  piperacillin-tazobactam (ZOSYN) IVPB 3.375 g ( Intravenous Automatically Held 10/01/18 2200)  0.9 %  sodium chloride infusion ( Intravenous New Bag/Given 09/23/18 1738)  0.9 % irrigation (POUR BTL) (1,000 mLs Irrigation Given 09/23/18 1829)  piperacillin-tazobactam (ZOSYN) IVPB 3.375 g (0 g Intravenous Stopped 09/23/18 1700)  propofol (DIPRIVAN) 10 mg/mL bolus/IV push (has no administration in time range)  fentaNYL (SUBLIMAZE) 250 MCG/5ML injection (has no administration in time range)  midazolam (VERSED) 2 MG/2ML injection (has no administration in time range)  phenylephrine 0.4-0.9 MG/10ML-% injection (has no administration in time range)   dexamethasone (DECADRON) 10 MG/ML injection (has no administration in time range)  ondansetron (ZOFRAN) 4 MG/2ML injection (has no administration in time range)     Initial Impression / Assessment and Plan / ED Course  I have reviewed the triage vital signs and the nursing notes.  Pertinent labs & imaging results that were available during my care of the patient were reviewed by me and considered in my medical decision making (see chart for details).          Assessment/Plan: 58 year old male presents today with a very large rectal abscess.  This is not amenable to I&D at bedside.  General surgery consulted who will take the patient to the OR.    Final Clinical Impressions(s) / ED Diagnoses   Final diagnoses:  Rectal abscess    ED Discharge Orders    None       Francee Gentile 09/23/18 Liliana Cline, MD 09/24/18 [phone]

## 2018-09-23 NOTE — H&P (Signed)
[facility] Consult/Admission Note  Lynne LoganRichard A Nyce 1960/05/04  [id number].    Requesting Provider: Jeralyn BennettJeffery Hedges, PA-C Chief Complaint/Reason for Consult: perirectal abscess  HPI:   Pt is a 58 yo male with a hx of GERD, HTN and perirectal abscess who presented to the ED today with complaints of buttock pain. Pt states the pain started 2 days ago and has progressively worsened to severe, constant, worse with touch and sitting, non radiating, with no associated symptoms. No drainage noted. He tried 800mg  of ibuprofen which helped for about 30 mintues before the pain returned. He tried warm baths without relief. No fever, chills, abdominal pain, vomiting or nausea. BM's have been normal.   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 This measures a maximum of 8.3 x 5.0 cm  ROS:  Review of Systems  Constitutional: Negative for chills, diaphoresis and fever.  HENT: Negative for sore throat.   Respiratory: Negative for cough and shortness of breath.   Cardiovascular: Negative for chest pain.  Gastrointestinal: Negative for abdominal pain, blood in stool, constipation, diarrhea, nausea and vomiting.  Genitourinary: Negative for dysuria.       + L buttock pain and swelling  Skin: Negative for rash.  Neurological: Negative for dizziness and loss of consciousness.  All other systems reviewed and are negative.    Family History  Problem Relation Age of Onset  . Diabetes Mother   . Hypertension Mother   . Hypertension Father   . Diabetes Sister   . Hypertension Sister   . Hypertension Brother   . Hypertension Sister     Past Medical History:  Diagnosis Date  . GERD (gastroesophageal reflux disease)   . Hypertension   . Perirectal abscess     Past Surgical History:  Procedure Laterality Date  . INCISION AND DRAINAGE PERIRECTAL ABSCESS N/A 01/28/2018   Procedure:  IRRIGATION AND DEBRIDEMENT PERIRECTAL ABSCESS;  Surgeon: Sheliah HatchKinsinger, De BlanchLuke Aaron, MD;  Location: [facility] OR;  Service: General;  Laterality: N/A;  . NO PAST SURGERIES      Social History:  reports that he has been smoking cigarettes. He has been smoking about 0.50 packs per day. He has never used smokeless tobacco. He reports current alcohol use. He reports that he does not use drugs.  Allergies: No Known Allergies  (Not in a hospital admission)   Blood pressure 126/89, pulse 85, temperature 97.7 F (36.5 C), temperature source Oral, resp. rate 14, SpO2 100 %.  Physical Exam Constitutional:      General: He is not in acute distress.    Appearance: Normal appearance. He is underweight. He is not ill-appearing, toxic-appearing or diaphoretic.  HENT:     Head: Normocephalic and atraumatic.     Nose: Nose normal.     Mouth/Throat:     Lips: Pink.     Mouth: Mucous membranes are moist.     Dentition: Abnormal dentition (numerous missing teeth).     Pharynx: Oropharynx is clear.  Eyes:     General: No scleral icterus.       Right eye: No discharge.        Left eye: No discharge.     Conjunctiva/sclera: Conjunctivae normal.     Pupils: Pupils are equal, round, and reactive to light.  Neck:     Musculoskeletal: Normal range of motion and neck supple.  Cardiovascular:     Rate and Rhythm: Normal  rate and regular rhythm.     Pulses:          Dorsalis pedis pulses are 2+ on the right side and 2+ on the left side.     Heart sounds: Normal heart sounds. No murmur.  Pulmonary:     Effort: Pulmonary effort is normal. No respiratory distress.     Breath sounds: Normal breath sounds. No wheezing, rhonchi or rales.  Abdominal:     General: Bowel sounds are normal. There is no distension.     Palpations: Abdomen is soft. Abdomen is not rigid.     Tenderness: There is no abdominal tenderness. There is no guarding.  Genitourinary:    Comments: L buttock with large area of induration, erythema,  fluctuance and TTP Musculoskeletal: Normal range of motion.        General: No tenderness or deformity.     Right lower leg: No edema.     Left lower leg: No edema.  Skin:    General: Skin is warm and dry.     Findings: No rash.  Neurological:     Mental Status: He is alert and oriented to person, place, and time.  Psychiatric:        Mood and Affect: Mood normal.        Behavior: Behavior normal.     Results for orders placed or performed during the hospital encounter of 09/23/18 (from the past 48 hour(s))  CBC with Differential     Status: Abnormal   Collection Time: 09/23/18 12:19 PM  Result Value Ref Range   WBC 13.3 (H) 4.0 - 10.5 K/uL   RBC 4.74 4.22 - 5.81 MIL/uL   Hemoglobin 12.1 (L) 13.0 - 17.0 g/dL   HCT 39.4 39.0 - 52.0 %   MCV 83.1 80.0 - 100.0 fL   MCH 25.5 (L) 26.0 - 34.0 pg   MCHC 30.7 30.0 - 36.0 g/dL   RDW 18.3 (H) 11.5 - 15.5 %   Platelets 228 150 - 400 K/uL   nRBC 0.0 0.0 - 0.2 %   Neutrophils Relative % 87 %   Neutro Abs 11.6 (H) 1.7 - 7.7 K/uL   Lymphocytes Relative 4 %   Lymphs Abs 0.5 (L) 0.7 - 4.0 K/uL   Monocytes Relative 7 %   Monocytes Absolute 1.0 0.1 - 1.0 K/uL   Eosinophils Relative 1 %   Eosinophils Absolute 0.1 0.0 - 0.5 K/uL   Basophils Relative 0 %   Basophils Absolute 0.0 0.0 - 0.1 K/uL   Immature Granulocytes 1 %   Abs Immature Granulocytes 0.12 (H) 0.00 - 0.07 K/uL    Comment: Performed at [facility], [address] [place], [place] [address]  Basic metabolic panel     Status: Abnormal   Collection Time: 09/23/18 12:19 PM  Result Value Ref Range   Sodium 134 (L) 135 - 145 mmol/L   Potassium 3.7 3.5 - 5.1 mmol/L   Chloride 101 98 - 111 mmol/L   CO2 16 (L) 22 - 32 mmol/L   Glucose, Bld 116 (H) 70 - 99 mg/dL   BUN 19 6 - 20 mg/dL   Creatinine, Ser 2.33 (H) 0.61 - 1.24 mg/dL   Calcium 9.1 8.9 - 10.3 mg/dL   GFR calc non Af Amer 30 (L) >60 mL/min   GFR calc Af Amer 35 (L) >60 mL/min   Anion gap 17 (H) 5 - 15    Comment:  Performed at [facility]  [address] [place], [place] [address]   Ct Pelvis Wo Contrast  Result Date: 09/23/2018 CLINICAL DATA:  Left buttock and peroneal abscess. EXAM: CT PELVIS WITHOUT CONTRAST TECHNIQUE: Multidetector CT imaging of the pelvis was performed following the standard protocol without intravenous contrast. COMPARISON:  CT scan 01/28/2018 FINDINGS: Urinary Tract: The bladder is grossly normal. No bladder calculi or mass. A right renal calculus is noted. No hydroureteronephrosis. Bowel: The visualized small bowel and colon are grossly normal without oral contrast. Colonic diverticulosis is noted. Vascular/Lymphatic: Significant age advanced atherosclerotic calcifications involving the lower aorta and iliac and common femoral arteries. No inguinal or pelvic adenopathy. Scattered lymph nodes are noted. Reproductive: The prostate gland and seminal vesicles are unremarkable. 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 This measures a maximum of 8.3 x 5.0 cm. MRI without and with contrast may be helpful for further evaluation. There appears to be some prolapse of the rectal mucosa or hemorrhoids. Musculoskeletal: No significant bony findings. 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 IMPRESSION: 1. 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 MRI pelvis without and with contrast may be helpful for more definitive evaluation and pre-surgical planning. 2. Age advanced vascular calcifications. 3. Right renal calculus. 4. No intrapelvic abscess or adenopathy. Electronically Signed   By: Rudie MeyerP.  Gallerani M.D.   On: 09/23/2018 15:21      Assessment/Plan Active Problems:   Perirectal abscess  GERD - IV protonix HTN - home meds  Perirectal abscess - OR this evening with Dr. Cliffton AstersWhite, IVF Elevated Creatinine - IVF, am labs  FEN: NPO for OR VTE: SCD's, lovenox to start tomorrow ID: [email or web address] 07/22>> Foley:  none Follow up: Dr. Cliffton AstersWhite  Plan: Admit, IVF, IV abx, OR today   Jerre SimonJessica L Gardner Servantes, [facility] 09/23/2018, 4:19 PM Pager: [phone] Consults: [phone] Mon-Fri 7:00 am-4:30 pm Sat-Sun 7:00 am-11:30 am

## 2018-09-23 NOTE — Progress Notes (Signed)
Pharmacy Antibiotic Note  Erik Clay is a 58 y.o. male admitted on 09/23/2018 with wound infection.  Pt has abscess on left buttock which started yesterday. He states this is the same that he has had before. Pt previously treated w/ 1g ceftriaxone in ED and d/c'd on augmentin. Pt is presenting with similar abscess. Tmax 97.7 and WBC 13.3. Pharmacy has been consulted for zosyn dosing.  Plan: Zosyn 3.375g IV x1 (54min infusion) then zosyn 3.375g IV q8h (extended infusion) Monitor renal function and clinical progression F/u C&S  Height: 5\' 8"  (172.7 cm) Weight: 109 lb (49.4 kg) IBW/kg (Calculated) : 68.4  Temp (24hrs), Avg:98.3 F (36.8 C), Min:97.7 F (36.5 C), Max:98.9 F (37.2 C)  Recent Labs  Lab 09/23/18 1219  WBC 13.3*  CREATININE 2.33*    Estimated Creatinine Clearance: 24.7 mL/min (A) (by C-G formula based on SCr of 2.33 mg/dL (H)).    No Known Allergies  Antimicrobials this admission: Zosyn 7/22 >>   Dose adjustments this admission: n/a  Microbiology results: Pending  Thank you for allowing pharmacy to be a part of this patient's care.  Erik Clay 09/23/2018 4:27 PM

## 2018-09-23 NOTE — Anesthesia Procedure Notes (Signed)
Procedure Name: LMA Insertion Date/Time: 09/23/2018 6:16 PM Performed by: Oletta Lamas, CRNA Pre-anesthesia Checklist: Patient identified, Emergency Drugs available, Suction available and Patient being monitored Patient Re-evaluated:Patient Re-evaluated prior to induction Oxygen Delivery Method: Circle System Utilized Preoxygenation: Pre-oxygenation with 100% oxygen Induction Type: IV induction Ventilation: Mask ventilation without difficulty LMA: LMA inserted LMA Size: 4.0 Number of attempts: 1 Placement Confirmation: positive ETCO2 Tube secured with: Tape Dental Injury: Teeth and Oropharynx as per pre-operative assessment

## 2018-09-23 NOTE — Op Note (Addendum)
09/23/2018  6:51 PM  PATIENT:  Erik Clay  58 y.o. male  Patient Care Team: Claiborne RiggFleming, Zelda W, NP as PCP - General (Nurse Practitioner)  PRE-OPERATIVE DIAGNOSIS:  Left buttock/perianal abscess  POST-OPERATIVE DIAGNOSIS:  Same  PROCEDURE:  1. Incision and drainage of left buttock abscess with placement of penrose drain 2. Anorectal exam under anesthesia  SURGEON:  Surgeon(s): Andria MeuseWhite, Dequandre Cordova M, MD  ANESTHESIA:   general/LMA  SPECIMEN:  Wound cultures - aerobe, anaerobe, fungal  DISPOSITION OF SPECIMEN:  Lab  COUNTS:  Sponge, needle, and instrument counts were reported correct x2 at conclusion.  EBL: 10 mL  Drains: 1/4 inch penrose left draining cavity with counterincision  PLAN OF CARE: Admit for overnight observation  PATIENT DISPOSITION:  PACU - hemodynamically stable.  INDICATION: Erik Clay is a pleasant [id number] with hx of HTN and GERD as well as recurrent buttock abscesses - now undergone drainage x2. Last was 01/2018. He was seen in follow-up for a left lateral prolapsing hemorrhoid with possible condyloma change and noted to have no signs of fistula at that time. He was lost to follow-up. Returned to ED today with recurrent large left buttock abscess. He was seen and evaluated and recommended for drainage with possible seton placement. Given infected field, we opted not to address this prolapsing hemorrhoid which may have condyloma overlying it vs chronic changes related to hemorrhoid prolapse.  OR FINDINGS: Large left buttock abscess - contained ~50 cc of foul smelling pus. This cavity appears chronic with granulation/chronic abscess wall tissue inside that was quite oozy. Counter incision made anteriorly and 1/4 inch penrose placed. An additional counter incision was made posteriorly but short enough from middle opening that a drain was not necessary. Prolapsing hemorrhoid left lateral with chronic changes - soft, not firm/fixed; given extensive pus contamination,  this was not addressed this go-round.  DESCRIPTION: The patient was identified in the preoperative holding area and taken to the OR where he was placed on the operating room table. General anesthesia was induced without difficulty with LMA. The patient was then positioned in high lithotomy position with Allen stirrups.  He was then prepped and draped in usual sterile fashion with betadine. Pus began to spontaneously drain at this point.  A surgical timeout was performed indicating the correct patient, procedure, positioning and need for preoperative antibiotics.   A well lubricated digital rectal exam was performed which demonstrated known left lateral hemorrhoid and condylomatous like tissue overlying vs chronic prolapse changes.  A Hill-Ferguson anoscope was into the anal canal and circumferential inspection demonstrated no evidence of masses nor obvious internal openings. The area of active drainage was widened on the left buttock. At least 50 mL of foul smelling pus drained. Cultures were obtained. 2 counter incisions were made after all loculations were broken up bluntly. A 1/4 inch penrose was placed through one of these and secured to itself with 2-0 nylon suture; the other opening was in close proximity to the middle opening. The anal area was then inspected. The medial aspect of the abscess cavity was probed with a lacrimal probe, taking care not to create any false passages. No fistulous tract could be proven. The abscess cavity was too large for attempts at peroxide instillation. The wound was irrigated. Hemostasis was achieved with electrocautery. A single piece of moist kerlex was packed in the wound given chronic appearance to ensure maintained hemostasis. This was covered with 4x4s, ABD pad and secured with mesh underwear. He was then taken out of  lithotomy, awakened from anesthesia, extubated and transferred to a stretcher for transport to pacu in satisfactory condition.  DISPOSITION: PACU in  satisfactory condition.

## 2018-09-23 NOTE — Discharge Instructions (Addendum)
Go to ER for evaluation of large abscess on left buttock.

## 2018-09-23 NOTE — ED Triage Notes (Signed)
Pt reports abscess on buttocks since yesterday, went to UC today.

## 2018-09-23 NOTE — ED Notes (Signed)
Patient transported to CT 

## 2018-09-23 NOTE — Anesthesia Postprocedure Evaluation (Signed)
Anesthesia Post Note  Patient: Erik Clay  Procedure(s) Performed: INCISION AND DRAINAGE PERIRECTAL ABSCESS (N/A Rectum)     Patient location during evaluation: PACU Anesthesia Type: General Level of consciousness: awake and alert Pain management: pain level controlled Vital Signs Assessment: post-procedure vital signs reviewed and stable Respiratory status: spontaneous breathing, nonlabored ventilation and respiratory function stable Cardiovascular status: blood pressure returned to baseline and stable Postop Assessment: no apparent nausea or vomiting Anesthetic complications: no    Last Vitals:  Vitals:   09/23/18 1930 09/23/18 1940  BP: (!) 133/100 (!) 133/92  Pulse: 83 80  Resp: 16 13  Temp:  (!) 36.4 C  SpO2: 96% 100%    Last Pain:  Vitals:   09/23/18 1940  TempSrc:   PainSc: 0-No pain                 Kerria Sapien,W. EDMOND

## 2018-09-24 ENCOUNTER — Encounter (HOSPITAL_COMMUNITY): Payer: Self-pay | Admitting: Surgery

## 2018-09-24 DIAGNOSIS — Z20828 Contact with and (suspected) exposure to other viral communicable diseases: Secondary | ICD-10-CM | POA: Diagnosis present

## 2018-09-24 DIAGNOSIS — R636 Underweight: Secondary | ICD-10-CM | POA: Diagnosis present

## 2018-09-24 DIAGNOSIS — I10 Essential (primary) hypertension: Secondary | ICD-10-CM | POA: Diagnosis present

## 2018-09-24 DIAGNOSIS — Z79899 Other long term (current) drug therapy: Secondary | ICD-10-CM | POA: Diagnosis not present

## 2018-09-24 DIAGNOSIS — L0231 Cutaneous abscess of buttock: Secondary | ICD-10-CM | POA: Diagnosis present

## 2018-09-24 DIAGNOSIS — Z833 Family history of diabetes mellitus: Secondary | ICD-10-CM | POA: Diagnosis not present

## 2018-09-24 DIAGNOSIS — F17210 Nicotine dependence, cigarettes, uncomplicated: Secondary | ICD-10-CM | POA: Diagnosis present

## 2018-09-24 DIAGNOSIS — K648 Other hemorrhoids: Secondary | ICD-10-CM | POA: Diagnosis present

## 2018-09-24 DIAGNOSIS — Z681 Body mass index (BMI) 19 or less, adult: Secondary | ICD-10-CM | POA: Diagnosis not present

## 2018-09-24 DIAGNOSIS — Z8249 Family history of ischemic heart disease and other diseases of the circulatory system: Secondary | ICD-10-CM | POA: Diagnosis not present

## 2018-09-24 DIAGNOSIS — R7989 Other specified abnormal findings of blood chemistry: Secondary | ICD-10-CM | POA: Diagnosis present

## 2018-09-24 DIAGNOSIS — K612 Anorectal abscess: Secondary | ICD-10-CM | POA: Diagnosis present

## 2018-09-24 DIAGNOSIS — K219 Gastro-esophageal reflux disease without esophagitis: Secondary | ICD-10-CM | POA: Diagnosis present

## 2018-09-24 LAB — BASIC METABOLIC PANEL
Anion gap: 11 (ref 5–15)
BUN: 17 mg/dL (ref 6–20)
CO2: 18 mmol/L — ABNORMAL LOW (ref 22–32)
Calcium: 8.1 mg/dL — ABNORMAL LOW (ref 8.9–10.3)
Chloride: 105 mmol/L (ref 98–111)
Creatinine, Ser: 1.84 mg/dL — ABNORMAL HIGH (ref 0.61–1.24)
GFR calc Af Amer: 46 mL/min — ABNORMAL LOW (ref >60)
GFR calc non Af Amer: 40 mL/min — ABNORMAL LOW (ref >60)
Glucose, Bld: 148 mg/dL — ABNORMAL HIGH (ref 70–99)
Potassium: 4 mmol/L (ref 3.5–5.1)
Sodium: 134 mmol/L — ABNORMAL LOW (ref 135–145)

## 2018-09-24 LAB — CBC
HCT: 31.9 % — ABNORMAL LOW (ref 39.0–52.0)
Hemoglobin: 10 g/dL — ABNORMAL LOW (ref 13.0–17.0)
MCH: 25.9 pg — ABNORMAL LOW (ref 26.0–34.0)
MCHC: 31.3 g/dL (ref 30.0–36.0)
MCV: 82.6 fL (ref 80.0–100.0)
Platelets: 236 10*3/uL (ref 150–400)
RBC: 3.86 MIL/uL — ABNORMAL LOW (ref 4.22–5.81)
RDW: 18 % — ABNORMAL HIGH (ref 11.5–15.5)
WBC: 13 10*3/uL — ABNORMAL HIGH (ref 4.0–10.5)
nRBC: 0 % (ref 0.0–0.2)

## 2018-09-24 MED ORDER — PANTOPRAZOLE SODIUM 40 MG PO TBEC
40.0000 mg | DELAYED_RELEASE_TABLET | Freq: Every day | ORAL | Status: DC
  Administered 2018-09-24 – 2018-09-26 (×3): 40 mg via ORAL
  Filled 2018-09-24 (×3): qty 1

## 2018-09-24 NOTE — TOC Initial Note (Addendum)
Transition of Care [facility]) - Initial/Assessment Note    Patient Details  Name: Erik Clay MRN: [id number] Date of Birth: 1960/03/16  Transition of Care [facility]) CM/SW Contact:    Carles Collet, RN Phone Number: 09/24/2018, 3:39 PM  Clinical Narrative:                 Damaris Schooner w patient at bedside. He is from home alone. He will be discharged with a penrose drain to his backside.  He states that he took a cab to urgent care and may need assistance with ride home/ bus pass. Patient will need HHRN per sx note. He does not have preference for [facility] provider. PCP Jensen If meds for home can be delivered to room by [facility] pharmacy Friday it would assist him in not needing an extra stop on the way home to get meds. He states he has his debit card to pay for copays.  Addendum- Spoke w insurance provider. Patient does not have home health coverage with his insurance, this does not qualify him to get charity home health services either.    Expected Discharge Plan: [place] Barriers to Discharge: Continued Medical Work up   Patient Goals and CMS Choice Patient states their goals for this hospitalization and ongoing recovery are:: to go home      Expected Discharge Plan and Services Expected Discharge Plan: [place]                         DME Arranged: N/A         HH Arranged: RN          Prior Living Arrangements/Services   Lives with:: Self                   Activities of Daily Living Home Assistive Devices/Equipment: None ADL Screening (condition at time of admission) Patient's cognitive ability adequate to safely complete daily activities?: Yes Is the patient deaf or have difficulty hearing?: No Does the patient have difficulty seeing, even when wearing glasses/contacts?: No Does the patient have difficulty concentrating, remembering, or making decisions?: No Patient able to express need for assistance with ADLs?: Yes Does the  patient have difficulty dressing or bathing?: No Independently performs ADLs?: Yes (appropriate for developmental age) Does the patient have difficulty walking or climbing stairs?: No Weakness of Legs: None Weakness of Arms/Hands: None  Permission Sought/Granted                  Emotional Assessment              Admission diagnosis:  Abscess Patient Active Problem List   Diagnosis Date Noted  . Perirectal abscess 09/23/2018  . Essential hypertension 12/08/2016  . Gastroesophageal reflux disease 12/08/2016   PCP:  Gildardo Pounds, NP Pharmacy:   [facility] [place] [address] Phone: [phone] Fax: [place], [place] [address] [place] [address] Phone: [phone] Fax: [phone]     Social Determinants of Health (SDOH) Interventions    Readmission Risk Interventions No flowsheet data found.

## 2018-09-24 NOTE — Progress Notes (Signed)
[facility] Progress Note  1 Day Post-Op   Assessment/Plan Perirectal abscess - Incision and drainage of left buttock abscess and placement of penrose drain, Dr. Cliffton AstersWhite, 07/22 - wet to dry dressing changes Elevated Creatinine - improved, IVF, am labs  FEN: reg diet VTE: SCD's, lovenox  ID: Zosyn 07/22>> Foley: none Follow up: Dr. Cliffton AstersWhite  Plan: recheck labs in am, dressing changes, likely discharge tomorrow.    LOS: 0 days    Subjective: CC: buttock pain  No issues overnight. Pain well controlled.   Objective: Vital signs in last 24 hours: Temp:  [97.1 F (36.2 C)-98.6 F (37 C)] 98.6 F (37 C) (07/23 1011) Pulse Rate:  [62-102] 67 (07/23 1011) Resp:  [13-25] 14 (07/23 1011) BP: (118-150)/(78-101) 124/85 (07/23 1011) SpO2:  [95 %-100 %] 100 % (07/23 1011) Weight:  [49.4 kg] 49.4 kg (07/22 1600) Last BM Date: 09/23/18  Intake/Output from previous day: 07/22 0701 - 07/23 0700 In: 1006.5 [I.V.:956.5; IV Piggyback:50] Out: 810 [Urine:800; Blood:10] Intake/Output this shift: Total I/O In: 240 [P.O.:240] Out: -   PE: Gen:  Alert, NAD, pleasant, cooperative Card:  RRR, no M/G/R heard Pulm:  CTA, no W/R/R, effort normal GU: nurse chaperone, removed packing and repacked, penrose in place, improved induration. No purulent drainage noted. Pt required IV pain medicine for dressing change.  Skin: no rashes noted, warm and dry   Anti-infectives: Anti-infectives (From admission, onward)   Start     Dose/Rate Route Frequency Ordered Stop   09/23/18 2300  piperacillin-tazobactam (ZOSYN) IVPB 3.375 g     3.375 g 12.5 mL/hr over 240 Minutes Intravenous Every 8 hours 09/23/18 1626     09/23/18 1630  piperacillin-tazobactam (ZOSYN) IVPB 3.375 g     3.375 g 100 mL/hr over 30 Minutes Intravenous  Once 09/23/18 1626 09/23/18 1700      Lab Results:  Recent Labs    09/23/18 1219 09/24/18 0230  WBC 13.3* 13.0*  HGB 12.1* 10.0*  HCT 39.4 31.9*  PLT  228 236   BMET Recent Labs    09/23/18 1219 09/24/18 0230  NA 134* 134*  K 3.7 4.0  CL 101 105  CO2 16* 18*  GLUCOSE 116* 148*  BUN 19 17  CREATININE 2.33* 1.84*  CALCIUM 9.1 8.1*   PT/INR No results for input(s): LABPROT, INR in the last 72 hours. CMP     Component Value Date/Time   NA 134 (L) 09/24/2018 0230   NA 137 09/07/2018 1608   K 4.0 09/24/2018 0230   CL 105 09/24/2018 0230   CO2 18 (L) 09/24/2018 0230   GLUCOSE 148 (H) 09/24/2018 0230   BUN 17 09/24/2018 0230   BUN 9 09/07/2018 1608   CREATININE 1.84 (H) 09/24/2018 0230   CALCIUM 8.1 (L) 09/24/2018 0230   PROT 7.4 09/07/2018 1608   ALBUMIN 4.2 09/07/2018 1608   AST 14 09/07/2018 1608   ALT 6 09/07/2018 1608   ALKPHOS 82 09/07/2018 1608   BILITOT 0.6 09/07/2018 1608   GFRNONAA 40 (L) 09/24/2018 0230   GFRAA 46 (L) 09/24/2018 0230   Lipase  No results found for: LIPASE  Studies/Results: Ct Pelvis Wo Contrast  Result Date: 09/23/2018 CLINICAL DATA:  Left buttock and peroneal abscess. EXAM: CT PELVIS WITHOUT CONTRAST TECHNIQUE: Multidetector CT imaging of the pelvis was performed following the standard protocol without intravenous contrast. COMPARISON:  CT scan 01/28/2018 FINDINGS: Urinary Tract: The bladder is grossly normal. No bladder calculi or mass. A right renal calculus is noted.  No hydroureteronephrosis. Bowel: The visualized small bowel and colon are grossly normal without oral contrast. Colonic diverticulosis is noted. Vascular/Lymphatic: Significant age advanced atherosclerotic calcifications involving the lower aorta and iliac and common femoral arteries. No inguinal or pelvic adenopathy. Scattered lymph nodes are noted. Reproductive: The prostate gland and seminal vesicles are unremarkable. 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 This measures a maximum of 8.3 x 5.0 cm. MRI without and with contrast may be helpful for further evaluation. There appears to  be some prolapse of the rectal mucosa or hemorrhoids. Musculoskeletal: No significant bony findings. 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 IMPRESSION: 1. 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 MRI pelvis without and with contrast may be helpful for more definitive evaluation and pre-surgical planning. 2. Age advanced vascular calcifications. 3. Right renal calculus. 4. No intrapelvic abscess or adenopathy. Electronically Signed   By: Marijo Sanes M.D.   On: 09/23/2018 15:21      Kalman Drape , [facility] 09/24/2018, 11:07 AM  Pager: [phone] Mon-Wed, Friday 7:00am-4:30pm Thurs 7am-11:30am  Consults: [phone]

## 2018-09-25 LAB — BASIC METABOLIC PANEL
Anion gap: 9 (ref 5–15)
BUN: 11 mg/dL (ref 6–20)
CO2: 21 mmol/L — ABNORMAL LOW (ref 22–32)
Calcium: 8.4 mg/dL — ABNORMAL LOW (ref 8.9–10.3)
Chloride: 106 mmol/L (ref 98–111)
Creatinine, Ser: 2.01 mg/dL — ABNORMAL HIGH (ref 0.61–1.24)
GFR calc Af Amer: 42 mL/min — ABNORMAL LOW (ref >60)
GFR calc non Af Amer: 36 mL/min — ABNORMAL LOW (ref >60)
Glucose, Bld: 93 mg/dL (ref 70–99)
Potassium: 3.5 mmol/L (ref 3.5–5.1)
Sodium: 136 mmol/L (ref 135–145)

## 2018-09-25 LAB — CBC
HCT: 30.3 % — ABNORMAL LOW (ref 39.0–52.0)
Hemoglobin: 9.2 g/dL — ABNORMAL LOW (ref 13.0–17.0)
MCH: 25.6 pg — ABNORMAL LOW (ref 26.0–34.0)
MCHC: 30.4 g/dL (ref 30.0–36.0)
MCV: 84.4 fL (ref 80.0–100.0)
Platelets: 255 10*3/uL (ref 150–400)
RBC: 3.59 MIL/uL — ABNORMAL LOW (ref 4.22–5.81)
RDW: 18.2 % — ABNORMAL HIGH (ref 11.5–15.5)
WBC: 10.3 10*3/uL (ref 4.0–10.5)
nRBC: 0 % (ref 0.0–0.2)

## 2018-09-25 LAB — AEROBIC/ANAEROBIC CULTURE W GRAM STAIN (SURGICAL/DEEP WOUND): Culture: NORMAL

## 2018-09-25 NOTE — Progress Notes (Signed)
[facility] Progress Note  2 Days Post-Op   Assessment/Plan Perirectal abscess - Incision and drainage of left buttock abscess and placement of penrose drain, Dr. Dema Severin, 07/22 - wet to dry dressing changes Elevated Creatinine - increased from yesterday, IVF, am labs  FEN:reg diet VTE: SCD's, lovenox UT:MLYYT 07/22>> Foley:none Follow up:Dr. Dema Severin  Plan:recheck labs in am, dressing changes, likely discharge tomorrow pending kidney function. Holding losartan    LOS: 1 day    Subjective: CC: buttock pain  No issues overnight. Pain well controlled. He believes he had elevated creatinine last time this happened.   Objective: Vital signs in last 24 hours: Temp:  [97.8 F (36.6 C)-98.8 F (37.1 C)] 98.8 F (37.1 C) (07/24 0452) Pulse Rate:  [54-74] 54 (07/24 0452) Resp:  [14-18] 18 (07/24 0452) BP: (121-126)/(81-85) 126/81 (07/24 0452) SpO2:  [92 %-100 %] 92 % (07/24 0452) Last BM Date: 09/23/18  Intake/Output from previous day: 07/23 0701 - 07/24 0700 In: 2200.6 [P.O.:480; I.V.:1566.5; IV Piggyback:154] Out: 1150 [Urine:1150] Intake/Output this shift: No intake/output data recorded.  PE: Gen:  Alert, NAD, pleasant, cooperative Card:  RRR, no M/G/R heard Pulm:  CTA, no W/R/R, effort normal GU: penrose in place, minimal induration. No purulent drainage noted. Overall improved  Skin: no rashes noted, warm and dry  Anti-infectives: Anti-infectives (From admission, onward)   Start     Dose/Rate Route Frequency Ordered Stop   09/23/18 2300  piperacillin-tazobactam (ZOSYN) IVPB 3.375 g     3.375 g 12.5 mL/hr over 240 Minutes Intravenous Every 8 hours 09/23/18 1626     09/23/18 1630  piperacillin-tazobactam (ZOSYN) IVPB 3.375 g     3.375 g 100 mL/hr over 30 Minutes Intravenous  Once 09/23/18 1626 09/23/18 1700      Lab Results:  Recent Labs    09/24/18 0230 09/25/18 0322  WBC 13.0* 10.3  HGB 10.0* 9.2*  HCT 31.9* 30.3*  PLT 236  255   BMET Recent Labs    09/24/18 0230 09/25/18 0322  NA 134* 136  K 4.0 3.5  CL 105 106  CO2 18* 21*  GLUCOSE 148* 93  BUN 17 11  CREATININE 1.84* 2.01*  CALCIUM 8.1* 8.4*   PT/INR No results for input(s): LABPROT, INR in the last 72 hours. CMP     Component Value Date/Time   NA 136 09/25/2018 0322   NA 137 09/07/2018 1608   K 3.5 09/25/2018 0322   CL 106 09/25/2018 0322   CO2 21 (L) 09/25/2018 0322   GLUCOSE 93 09/25/2018 0322   BUN 11 09/25/2018 0322   BUN 9 09/07/2018 1608   CREATININE 2.01 (H) 09/25/2018 0322   CALCIUM 8.4 (L) 09/25/2018 0322   PROT 7.4 09/07/2018 1608   ALBUMIN 4.2 09/07/2018 1608   AST 14 09/07/2018 1608   ALT 6 09/07/2018 1608   ALKPHOS 82 09/07/2018 1608   BILITOT 0.6 09/07/2018 1608   GFRNONAA 36 (L) 09/25/2018 0322   GFRAA 42 (L) 09/25/2018 0322   Lipase  No results found for: LIPASE  Studies/Results: Ct Pelvis Wo Contrast  Result Date: 09/23/2018 CLINICAL DATA:  Left buttock and peroneal abscess. EXAM: CT PELVIS WITHOUT CONTRAST TECHNIQUE: Multidetector CT imaging of the pelvis was performed following the standard protocol without intravenous contrast. COMPARISON:  CT scan 01/28/2018 FINDINGS: Urinary Tract: The bladder is grossly normal. No bladder calculi or mass. A right renal calculus is noted. No hydroureteronephrosis. Bowel: The visualized small bowel and colon are grossly normal without oral contrast. Colonic  diverticulosis is noted. Vascular/Lymphatic: Significant age advanced atherosclerotic calcifications involving the lower aorta and iliac and common femoral arteries. No inguinal or pelvic adenopathy. Scattered lymph nodes are noted. Reproductive: The prostate gland and seminal vesicles are unremarkable. 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 This measures a maximum of 8.3 x 5.0 cm. MRI without and with contrast may be helpful for further evaluation. There appears to be some  prolapse of the rectal mucosa or hemorrhoids. Musculoskeletal: No significant bony findings. 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 IMPRESSION: 1. 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 MRI pelvis without and with contrast may be helpful for more definitive evaluation and pre-surgical planning. 2. Age advanced vascular calcifications. 3. Right renal calculus. 4. No intrapelvic abscess or adenopathy. Electronically Signed   By: Rudie MeyerP.  Gallerani M.D.   On: 09/23/2018 15:21      Jerre SimonJessica L Jeriyah Granlund , [facility] 09/25/2018, 9:56 AM  Pager: [phone] Mon-Wed, Friday 7:00am-4:30pm Thurs 7am-11:30am  Consults: [phone]

## 2018-09-25 NOTE — Discharge Instructions (Signed)
WOUND CARE: - dressing to be changed twice daily - supplies: sterile saline, kerlix, scissors, ABD pads, tape  - remove dressing and all packing carefully, moistening with sterile saline as needed to avoid packing/internal dressing sticking to the wound. - clean edges of skin around the wound with water/gauze, making sure there is no tape debris or leakage left on skin that could cause skin irritation or breakdown. - dampen and clean kerlix with sterile saline and pack wound from wound base to skin level, making sure to take note of any possible areas of wound tracking, tunneling and packing appropriately. Wound can be packed loosely. Trim kerlix to size if a whole kerlix is not required. - cover wound with a dry ABD pad and secure with tape.  - write the date/time on the dry dressing/tape to better track when the last dressing change occurred. - apply any skin protectant/powder recommended by clinician to protect skin/skin folds. - change dressing as needed if leakage occurs, wound gets contaminated, or patient requests to shower. - patient may shower daily with wound open and following the shower the wound should be dried and a clean dressing placed.     How to Take a [employer] Industries[employer] Bath A sitz bath is a warm water bath that may be used to care for your rectum, genital area, or the area between your rectum and genitals (perineum). For a sitz bath, the water only comes up to your hips and covers your buttocks. A sitz bath may done at home in a bathtub or with a portable sitz bath that fits over the toilet. Your health care provider may recommend a sitz bath to help:  Relieve pain and discomfort after delivering a baby.  Relieve pain and itching from hemorrhoids or anal fissures.  Relieve pain after certain surgeries.  Relax muscles that are sore or tight. How to take a sitz bath Take 3-4 sitz baths a day, or as many as told by your health care provider. Bathtub sitz bath To take a sitz bath in a  bathtub: 1. Partially fill a bathtub with warm water. The water should be deep enough to cover your hips and buttocks when you are sitting in the tub. 2. If your health care provider told you to put medicine in the water, follow his or her instructions. 3. Sit in the water. 4. Open the tub drain a little, and leave it open during your bath. 5. Turn on the warm water again, enough to replace the water that is draining out. Keep the water running throughout your bath. This helps keep the water at the right level and the right temperature. 6. Soak in the water for 15-20 minutes, or as long as told by your health care provider. 7. When you are done, be careful when you stand up. You may feel dizzy. 8. After the sitz bath, pat yourself dry. Do not rub your skin to dry it.  Over-the-toilet sitz bath To take a sitz bath with an over-the-toilet basin: 1. Follow the manufacturer's instructions. 2. Fill the basin with warm water. 3. If your health care provider told you to put medicine in the water, follow his or her instructions. 4. Sit on the seat. Make sure the water covers your buttocks and perineum. 5. Soak in the water for 15-20 minutes, or as long as told by your health care provider. 6. After the sitz bath, pat yourself dry. Do not rub your skin to dry it. 7. Clean and dry the basin between  uses. 8. Discard the basin if it cracks, or according to the manufacturer's instructions. Contact a health care provider if:  Your symptoms get worse. Do not continue with sitz baths if your symptoms get worse.  You have new symptoms. If this happens, do not continue with sitz baths until you talk with your health care provider. Summary  A sitz bath is a warm water bath in which the water only comes up to your hips and covers your buttocks.  A sitz bath may help relieve itching, relieve pain, and relax muscles that are sore or tight in the lower part of your body, including your genital area.  Take  3-4 sitz baths a day, or as many as told by your health care provider. Soak in the water for 15-20 minutes.  Do not continue with sitz baths if your symptoms get worse. This information is not intended to replace advice given to you by your health care provider. Make sure you discuss any questions you have with your health care provider. Document Released: 11/11/2003 Document Revised: 02/20/2017 Document Reviewed: 02/20/2017 Elsevier Patient Education  2020 [employer]     1. PAIN CONTROL:  1. Pain is best controlled by a usual combination of three different methods TOGETHER:  i. Over the counter pain medication ii. Prescription pain medication 2. It is helpful to take an over-the-counter pain medication regularly for the first few weeks. Choose one of the following that works best for you:  i. Naproxen (Aleve, etc) Two 220mg  tabs twice a day ii. Ibuprofen (Advil, etc) Three 200mg  tabs four times a day (every meal & bedtime) iii. Acetaminophen (Tylenol, etc) 500-650mg  four times a day (every meal & bedtime) 3. A prescription for pain medication (such as oxycodone, hydrocodone, etc) may be given to you upon discharge. Take your pain medication as prescribed.  i. If you are having problems/concerns with the prescription medicine (does not control pain, nausea, vomiting, rash, itching, etc), please call us [phone] to see if we need to switch you to a different pain medicine that will work better for you and/or control your side effect better. ii. If you need a refill on your pain medication, please contact your pharmacy. They will contact our office to request authorization. Prescriptions will not be filled after 5 pm or on week-ends. 1. Avoid getting constipated. When taking pain medications, it is common to experience some constipation. Increasing fluid intake and taking a fiber supplement (such as Metamucil, Citrucel, FiberCon, MiraLax, etc) 1-2 times a day regularly will usually help  prevent this problem from occurring. A mild laxative (prune juice, Milk of Magnesia, MiraLax, etc) should be taken according to package directions if there are no bowel movements after 48 hours.  2. Watch out for diarrhea. If you have many loose bowel movements, simplify your diet to bland foods & liquids for a few days. Stop any stool softeners and decrease your fiber supplement. Switching to mild anti-diarrheal medications (Kayopectate, Pepto Bismol) can help. If this worsens or does not improve, please call us. 3. Shower daily but do not bathe until your wounds heal. Cover your wounds with clean gauze and tape after showering.  4. FOLLOW UP  a. If a follow up appointment is needed one will be scheduled for you. Please call [facility] at [phone] if you have any questions about follow up.   WHEN TO CALL US [phone]:  1. Poor pain control 2. Reactions / problems with new medications (rash/itching, nausea, etc)  3. Fever over 101.5 F (38.5 C) 4. Worsening swelling or bruising 5. Redness, swelling, foul discharge or increased pain from wounds 6. Productive cough, difficulty breathing or any other concerning symptoms  The clinic staff is available to answer your questions during regular business hours (8:30am-5pm). Please dont hesitate to call and ask to speak to one of our nurses for clinical concerns.  If you have a medical emergency, go to the nearest emergency room or call 911.  A surgeon from [facility] is always on call at the [facility], [place], [place], [place], [place] [address] ?  MAIN: [phone] ? TOLL FREE: [phone] ?  FAX [phone]  [email or web address]

## 2018-09-26 LAB — CBC
HCT: 30.6 % — ABNORMAL LOW (ref 39.0–52.0)
Hemoglobin: 9.4 g/dL — ABNORMAL LOW (ref 13.0–17.0)
MCH: 25.5 pg — ABNORMAL LOW (ref 26.0–34.0)
MCHC: 30.7 g/dL (ref 30.0–36.0)
MCV: 82.9 fL (ref 80.0–100.0)
Platelets: 285 10*3/uL (ref 150–400)
RBC: 3.69 MIL/uL — ABNORMAL LOW (ref 4.22–5.81)
RDW: 18.1 % — ABNORMAL HIGH (ref 11.5–15.5)
WBC: 7.1 10*3/uL (ref 4.0–10.5)
nRBC: 0 % (ref 0.0–0.2)

## 2018-09-26 LAB — BASIC METABOLIC PANEL
Anion gap: 9 (ref 5–15)
BUN: 9 mg/dL (ref 6–20)
CO2: 24 mmol/L (ref 22–32)
Calcium: 8.2 mg/dL — ABNORMAL LOW (ref 8.9–10.3)
Chloride: 102 mmol/L (ref 98–111)
Creatinine, Ser: 1.42 mg/dL — ABNORMAL HIGH (ref 0.61–1.24)
GFR calc Af Amer: >60 mL/min (ref >60)
GFR calc non Af Amer: 55 mL/min — ABNORMAL LOW (ref >60)
Glucose, Bld: 77 mg/dL (ref 70–99)
Potassium: 3.5 mmol/L (ref 3.5–5.1)
Sodium: 135 mmol/L (ref 135–145)

## 2018-09-26 MED ORDER — ACETAMINOPHEN 500 MG PO TABS: 1000.0000 mg | ORAL_TABLET | Freq: Four times a day (QID) | ORAL | 0 refills | Status: AC

## 2018-09-26 MED ORDER — OXYCODONE HCL 5 MG PO TABS: 5.0000 mg | ORAL_TABLET | ORAL | 0 refills | Status: DC | PRN

## 2018-09-26 NOTE — Discharge Summary (Signed)
Physician Discharge Summary  Patient ID: Erik Clay MRN: [id number] DOB/AGE: Apr 17, 1960 58 y.o.  PCP: Gildardo Pounds, NP  Admit date: 09/23/2018 Discharge date: 09/26/2018  Admission Diagnoses:  Gluteal abscess  Discharge Diagnoses:  same  Active Problems:   Perirectal abscess   Surgery:  Drainage per Dr. Annye English with placement of penrose drain  Discharged Condition: improved  Hospital Course:   Had surgery by Dr. Dema Severin.  Improved.  Some concern for renal function.  NSAID and this was held and patient sent home on oxycodone.  To follow up at [facility] on Monday.    Consults: none  Significant Diagnostic Studies: none    Discharge Exam: Blood pressure (!) 146/91, pulse (!) 52, temperature 98 F (36.7 C), temperature source Oral, resp. rate 18, height 5\' 8"  (1.727 m), weight 49.4 kg, SpO2 97 %. Left buttock abscess post drainage with placement of penrose drain.    Disposition: Discharge disposition: 01-Home or Self Care       Discharge Instructions    Call MD for:  severe uncontrolled pain   Complete by: As directed    Call MD for:  temperature >100.4   Complete by: As directed    Diet - low sodium heart healthy   Complete by: As directed    Discharge instructions   Complete by: As directed    Daily dressing changes and check with [facility] on Monday   Increase activity slowly   Complete by: As directed      Allergies as of 09/26/2018   No Known Allergies     Medication List    STOP taking these medications   ibuprofen 800 MG tablet Commonly known as: ADVIL     TAKE these medications   acetaminophen 500 MG tablet Commonly known as: TYLENOL Take 2 tablets (1,000 mg total) by mouth every 6 (six) hours.   amLODipine 10 MG tablet Commonly known as: NORVASC Take 1 tablet (10 mg total) by mouth daily.   losartan 100 MG tablet Commonly known as: COZAAR Take 1 tablet (100 mg total) by mouth daily.   mupirocin ointment 2  % Commonly known as: Bactroban One application to affected area 2 (two) times per day. What changed:   how much to take  how to take this  when to take this  reasons to take this  additional instructions   nicotine 14 mg/24hr patch Commonly known as: NICODERM CQ - dosed in mg/24 hours Place 1 patch (14 mg total) onto the skin daily.   omeprazole 20 MG capsule Commonly known as: PRILOSEC TAKE 1 CAPSULE BY MOUTH DAILY.   oxyCODONE 5 MG immediate release tablet Commonly known as: Oxy IR/ROXICODONE Take 1-2 tablets (5-10 mg total) by mouth every 4 (four) hours as needed for moderate pain.      Follow-up [facility], [place]. Go on 10/06/2018.   Specialty: General Surgery Why: 9:15am for your follow up wound check. Please arrive 15 min early to complete paperwork. Please bring photo ID and insurance card Contact information: [address] [place] [phone]          Signed: Pedro Earls 09/26/2018, 8:32 AM

## 2018-09-26 NOTE — Progress Notes (Signed)
Discharged home today. Personal belongings,discharged instructions given. Verbalized understanding of the instructions

## 2018-10-05 ENCOUNTER — Other Ambulatory Visit: Payer: Self-pay | Admitting: Nurse Practitioner

## 2018-10-05 DIAGNOSIS — K219 Gastro-esophageal reflux disease without esophagitis: Secondary | ICD-10-CM

## 2018-10-05 MED FILL — AMLODIPINE BESYLATE 10 MG T: 10 | 30 days supply | Qty: 30 | Fill #2

## 2018-10-06 MED FILL — OMEPRAZOLE 20 MG CAP: 20 | 30 days supply | Qty: 30 | Fill #0

## 2018-10-23 ENCOUNTER — Ambulatory Visit: Payer: PRIVATE HEALTH INSURANCE | Admitting: Pharmacist

## 2018-10-23 LAB — FUNGUS CULTURE WITH STAIN

## 2018-10-23 LAB — FUNGUS CULTURE RESULT

## 2018-10-23 LAB — FUNGAL ORGANISM REFLEX

## 2018-11-12 MED FILL — OMEPRAZOLE 20 MG CAP: 20 | 30 days supply | Qty: 30 | Fill #0

## 2018-11-12 MED FILL — LOSARTAN POTASSIUM 100 MG T: 100 | 30 days supply | Qty: 30 | Fill #1

## 2018-11-12 MED FILL — AMLODIPINE BESYLATE 10 MG T: 10 | 10 days supply | Qty: 30 | Fill #2

## 2018-12-14 ENCOUNTER — Ambulatory Visit (HOSPITAL_COMMUNITY)
Admission: EM | Admit: 2018-12-14 | Discharge: 2018-12-14 | Disposition: A | Discharge status: Home / Self Care | Payer: PRIVATE HEALTH INSURANCE

## 2018-12-14 ENCOUNTER — Other Ambulatory Visit: Payer: Self-pay

## 2018-12-14 ENCOUNTER — Encounter (HOSPITAL_COMMUNITY): Payer: Self-pay

## 2018-12-14 DIAGNOSIS — L0231 Cutaneous abscess of buttock: Secondary | ICD-10-CM

## 2018-12-14 MED ORDER — AMOXICILLIN-POT CLAVULANATE 875-125 MG PO TABS: 1.0000 | ORAL_TABLET | Freq: Two times a day (BID) | ORAL | 0 refills | Status: AC

## 2018-12-14 MED FILL — AMOX-CLAV 875-125 MG TABLET: 875-125 | 10 days supply | Qty: 20 | Fill #0

## 2018-12-14 NOTE — Discharge Instructions (Signed)
Warm soaks and antibiotics for abscess.  It appears you likely have some prolapsed hemorrhoids as well.  With your history of surgery needs I would like you to follow up again with [facility] for recheck and evaluation.  Please go to the ER for any worsening of symptoms- increased pain, fevers- or otherwise worsening.

## 2018-12-14 NOTE — ED Triage Notes (Signed)
Pt presents to UC w/ c/o abscess x2 days. Pt states he went to the ER to have same abscess drained in July 2020.

## 2018-12-14 NOTE — ED Provider Notes (Signed)
[facility]    CSN: [id number] Arrival date & time: 12/14/18  0800      History   Chief Complaint Chief Complaint  Patient presents with  . left hip abscess    HPI Erik Clay is a 58 y.o. male.   Erik Clay presents with complaints of left buttock abscess. Has had this multiple times in the past. Had surgical I&D 7/22. He first noticed it two days ago. States it feels much less severe than his last episode. No fevers. No difficulty passing stool, no blood in stool. No abdominal pain. No known MRSA per chart review. History  Of htn and gerd.     ROS per HPI, negative if not otherwise mentioned.      Past Medical History:  Diagnosis Date  . GERD (gastroesophageal reflux disease)   . Hypertension   . Perirectal abscess     Patient Active Problem List   Diagnosis Date Noted  . Perirectal abscess 09/23/2018  . Essential hypertension 12/08/2016  . Gastroesophageal reflux disease 12/08/2016    Past Surgical History:  Procedure Laterality Date  . INCISION AND DRAINAGE ABSCESS N/A 09/23/2018   Procedure: INCISION AND DRAINAGE PERIRECTAL ABSCESS;  Surgeon: Ileana Roup, MD;  Location: [facility];  Service: General;  Laterality: N/A;  . INCISION AND DRAINAGE PERIRECTAL ABSCESS N/A 01/28/2018   Procedure: IRRIGATION AND DEBRIDEMENT PERIRECTAL ABSCESS;  Surgeon: Kieth Brightly Arta Bruce, MD;  Location: [facility];  Service: General;  Laterality: N/A;  . NO PAST SURGERIES         Home Medications    Prior to Admission medications   Medication Sig Start Date End Date Taking? Authorizing Provider  ibuprofen (ADVIL) 200 MG tablet Take 200 mg by mouth every 6 (six) hours as needed.   Yes [provider]  acetaminophen (TYLENOL) 500 MG tablet Take 2 tablets (1,000 mg total) by mouth every 6 (six) hours. 09/26/18   Johnathan Hausen, MD  amLODipine (NORVASC) 10 MG tablet Take 1 tablet (10 mg total) by mouth daily. 06/01/18   Gildardo Pounds, NP   amoxicillin-clavulanate (AUGMENTIN) 875-125 MG tablet Take 1 tablet by mouth every 12 (twelve) hours for 10 days. 12/14/18 12/24/18  Zigmund Gottron, NP  losartan (COZAAR) 100 MG tablet Take 1 tablet (100 mg total) by mouth daily. 09/07/18 12/06/18  Gildardo Pounds, NP  nicotine (NICODERM CQ - DOSED IN MG/24 HOURS) 14 mg/24hr patch Place 1 patch (14 mg total) onto the skin daily. Patient not taking: Reported on 02/18/2018 09/01/17   Gildardo Pounds, NP  omeprazole (PRILOSEC) 20 MG capsule TAKE 1 CAPSULE BY MOUTH DAILY. 10/05/18   Gildardo Pounds, NP  oxyCODONE (OXY IR/ROXICODONE) 5 MG immediate release tablet Take 1-2 tablets (5-10 mg total) by mouth every 4 (four) hours as needed for moderate pain. 09/26/18   Johnathan Hausen, MD    Family History Family History  Problem Relation Age of Onset  . Diabetes Mother   . Hypertension Mother   . Hypertension Father   . Diabetes Sister   . Hypertension Sister   . Hypertension Brother   . Hypertension Sister     Social History Social History   Tobacco Use  . Smoking status: Current Every Day Smoker    Packs/day: 0.50    Types: Cigarettes  . Smokeless tobacco: Never Used  Substance Use Topics  . Alcohol use: Yes    Comment: 1-2 per day  . Drug use: No  Allergies   Patient has no known allergies.   Review of Systems Review of Systems   Physical Exam Triage Vital Signs ED Triage Vitals  Enc Vitals Group     BP 12/14/18 0816 126/89     Pulse Rate 12/14/18 0816 63     Resp 12/14/18 0816 16     Temp 12/14/18 0816 98.4 F (36.9 C)     Temp Source 12/14/18 0816 Oral     SpO2 --      Weight --      Height --      Head Circumference --      Peak Flow --      Pain Score 12/14/18 0820 2     Pain Loc --      Pain Edu? --      Excl. in GC? --    No data found.  Updated Vital Signs BP 126/89 (BP Location: Left Arm)   Pulse 63   Temp 98.4 F (36.9 C) (Oral)   Resp 16    Physical Exam Constitutional:      Appearance:  He is well-developed.  Cardiovascular:     Rate and Rhythm: Normal rate.  Pulmonary:     Effort: Pulmonary effort is normal.  Skin:    General: Skin is warm and dry.     Comments: Left gluteal abscess visible and palpable, very minimal fluctuance, no induration; scarring noted; what appears to be prolapsed internal hemorrhoids also visualized to rectum without tenderness and soft   Neurological:     Mental Status: He is alert and oriented to person, place, and time.      UC Treatments / Results  Labs (all labs ordered are listed, but only abnormal results are displayed) Labs Reviewed - No data to display  EKG   Radiology No results found.  Procedures Procedures (including critical care time)  Medications Ordered in UC Medications - No data to display  Initial Impression / Assessment and Plan / UC Course  I have reviewed the triage vital signs and the nursing notes.  Pertinent labs & imaging results that were available during my care of the patient were reviewed by me and considered in my medical decision making (see chart for details).     Recurrent abscess. Afebrile. No tachycardia. Minimal pain. Remains small and with minimal fluctuance here today. I&D deferred at this time, augmentin initiated. Incidental finding of what appears to be prolapsed internal hemorrhoids? They are not bothering patient. Recommended follow up for recheck with [facility] as this is where he has followed in the past. Return precautions provided. Patient verbalized understanding and agreeable to plan.    Final Clinical Impressions(s) / UC Diagnoses   Final diagnoses:  Gluteal abscess     Discharge Instructions     Warm soaks and antibiotics for abscess.  It appears you likely have some prolapsed hemorrhoids as well.  With your history of surgery needs I would like you to follow up again with [facility] for recheck and evaluation.  Please go to the ER for any worsening  of symptoms- increased pain, fevers- or otherwise worsening.    ED Prescriptions    Medication Sig Dispense Auth. Provider   amoxicillin-clavulanate (AUGMENTIN) 875-125 MG tablet Take 1 tablet by mouth every 12 (twelve) hours for 10 days. 20 tablet Georgetta Haber, NP     PDMP not reviewed this encounter.   Georgetta Haber, NP 12/14/18 [phone]

## 2019-01-16 ENCOUNTER — Other Ambulatory Visit: Payer: Self-pay

## 2019-01-16 ENCOUNTER — Emergency Department (HOSPITAL_COMMUNITY): Payer: PRIVATE HEALTH INSURANCE

## 2019-01-16 ENCOUNTER — Emergency Department (HOSPITAL_COMMUNITY)
Admission: EM | Admit: 2019-01-16 | Discharge: 2019-01-16 | Disposition: A | Discharge status: Home / Self Care | Payer: PRIVATE HEALTH INSURANCE | Attending: Emergency Medicine | Admitting: Emergency Medicine

## 2019-01-16 ENCOUNTER — Encounter (HOSPITAL_COMMUNITY): Payer: Self-pay

## 2019-01-16 DIAGNOSIS — Y9289 Other specified places as the place of occurrence of the external cause: Secondary | ICD-10-CM | POA: Insufficient documentation

## 2019-01-16 DIAGNOSIS — Y999 Unspecified external cause status: Secondary | ICD-10-CM | POA: Diagnosis not present

## 2019-01-16 DIAGNOSIS — I10 Essential (primary) hypertension: Secondary | ICD-10-CM | POA: Diagnosis not present

## 2019-01-16 DIAGNOSIS — Y939 Activity, unspecified: Secondary | ICD-10-CM | POA: Insufficient documentation

## 2019-01-16 DIAGNOSIS — R519 Headache, unspecified: Secondary | ICD-10-CM | POA: Insufficient documentation

## 2019-01-16 DIAGNOSIS — Z79899 Other long term (current) drug therapy: Secondary | ICD-10-CM | POA: Insufficient documentation

## 2019-01-16 DIAGNOSIS — S0101XA Laceration without foreign body of scalp, initial encounter: Secondary | ICD-10-CM | POA: Diagnosis not present

## 2019-01-16 DIAGNOSIS — S0990XA Unspecified injury of head, initial encounter: Secondary | ICD-10-CM | POA: Diagnosis not present

## 2019-01-16 DIAGNOSIS — S0003XA Contusion of scalp, initial encounter: Secondary | ICD-10-CM | POA: Insufficient documentation

## 2019-01-16 DIAGNOSIS — F17210 Nicotine dependence, cigarettes, uncomplicated: Secondary | ICD-10-CM | POA: Insufficient documentation

## 2019-01-16 MED ORDER — IBUPROFEN 800 MG PO TABS
800.0000 mg | ORAL_TABLET | Freq: Once | ORAL | Status: AC
  Administered 2019-01-16: 800 mg via ORAL
  Filled 2019-01-16: qty 1

## 2019-01-16 NOTE — ED Notes (Signed)
Patient transported to CT 
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
## 2019-01-16 NOTE — ED Notes (Signed)
Pt verbalized understanding of d/c instruction and follow up care. Pt had no further questions.
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
## 2019-01-22 MED FILL — OMEPRAZOLE 20 MG CAP: 20 | 30 days supply | Qty: 30 | Fill #1

## 2019-01-22 MED FILL — LOSARTAN POTASSIUM 100 MG T: 100 | 30 days supply | Qty: 30 | Fill #2

## 2019-02-16 MED FILL — AMLODIPINE BESYLATE 10 MG T: 10 | 30 days supply | Qty: 30 | Fill #3

## 2019-04-05 ENCOUNTER — Other Ambulatory Visit: Payer: Self-pay | Admitting: Nurse Practitioner

## 2019-04-05 DIAGNOSIS — I10 Essential (primary) hypertension: Secondary | ICD-10-CM

## 2019-04-05 MED FILL — OMEPRAZOLE 20 MG CAP: 20 | 30 days supply | Qty: 30 | Fill #2

## 2019-04-07 MED FILL — LOSARTAN POTASSIUM 100 MG T: 100 | 30 days supply | Qty: 30 | Fill #0

## 2019-04-20 ENCOUNTER — Encounter: Payer: Self-pay | Admitting: Nurse Practitioner

## 2019-04-20 ENCOUNTER — Ambulatory Visit: Payer: Self-pay | Attending: Nurse Practitioner | Admitting: Nurse Practitioner

## 2019-04-20 ENCOUNTER — Other Ambulatory Visit: Payer: Self-pay

## 2019-04-20 VITALS — BP 150/97 | HR 96 | Temp 96.8°F | Ht 68.0 in | Wt 111.0 lb

## 2019-04-20 DIAGNOSIS — F17200 Nicotine dependence, unspecified, uncomplicated: Secondary | ICD-10-CM

## 2019-04-20 DIAGNOSIS — I10 Essential (primary) hypertension: Secondary | ICD-10-CM

## 2019-04-20 DIAGNOSIS — Z1211 Encounter for screening for malignant neoplasm of colon: Secondary | ICD-10-CM

## 2019-04-20 DIAGNOSIS — D649 Anemia, unspecified: Secondary | ICD-10-CM

## 2019-04-20 NOTE — Progress Notes (Signed)
Assessment & Plan:  Erik Clay was seen today for follow-up.  Diagnoses and all orders for this visit:  Essential hypertension -     CMP14+EGFR -     TSH Continue all antihypertensives as prescribed.  Remember to bring in your blood pressure log with you for your follow up appointment.  DASH/Mediterranean Diets are healthier choices for HTN.    Colon cancer screening -     Fecal occult blood, imunochemical([facility])  Tobacco dependence Erik Clay was counseled on the dangers of tobacco use, and was advised to quit. Reviewed strategies to maximize success, including removing cigarettes and smoking materials from environment, stress management and support of family/friends as well as pharmacological alternatives including: Wellbutrin, Chantix, Nicotine patch, Nicotine gum or lozenges. Smoking cessation support: smoking cessation hotline: 1-800-QUIT-NOW.  Smoking cessation classes are also available through [facility] and [facility]. Call [phone] or visit our website at [email or web address]   A total of 3 minutes was spent on counseling for smoking cessation and Erik Clay is ready to quit.   Anemia, unspecified type -     Ambulatory referral to Gastroenterology -     Ambulatory referral to Hematology -     CBC -     TSH    Patient has been counseled on age-appropriate routine health concerns for screening and prevention. These are reviewed and up-to-date. Referrals have been placed accordingly. Immunizations are up-to-date or declined.    Subjective:   Chief Complaint  Patient presents with  . Follow-up    Pt. is here for a follow up.    HPI Erik Clay 59 y.o. male presents to office today for follow up.  has a past medical history of GERD (gastroesophageal reflux disease) and Perirectal abscess.   I referred him to Gastroenterology for anemia/colonscopy however he still has not followed up. Will place new referrals today. I have encouraged him to  follow up due to his significant anemia. Weight loss and need for colonoscopy.   Essential Hypertension Endorses medication compliance taking amlodipine 10 mg and losartan 100 mg daily. However it appears his amlodipine 10 mg expired several months ago. Denies chest pain, shortness of breath, palpitations, lightheadedness, dizziness, headaches or BLE edema.  BP Readings from Last 3 Encounters:  04/20/19 (!) 150/97  01/16/19 (!) 154/107  12/14/18 126/89   GERD Taking omeprazole 20 mg as prescribed. Denies any epigastric pain, reflux, abdominal pain, nausea and vomiting.   Review of Systems  Constitutional: Positive for weight loss. Negative for fever and malaise/fatigue.  HENT: Negative.  Negative for nosebleeds.   Eyes: Negative.  Negative for blurred vision, double vision and photophobia.  Respiratory: Negative.  Negative for cough and shortness of breath.   Cardiovascular: Negative.  Negative for chest pain, palpitations and leg swelling.  Gastrointestinal: Positive for heartburn. Negative for abdominal pain, blood in stool, constipation, diarrhea, melena, nausea and vomiting.  Musculoskeletal: Negative.  Negative for myalgias.  Neurological: Negative.  Negative for dizziness, focal weakness, seizures and headaches.  Psychiatric/Behavioral: Negative.  Negative for suicidal ideas.    Past Medical History:  Diagnosis Date  . GERD (gastroesophageal reflux disease)   . Perirectal abscess     Past Surgical History:  Procedure Laterality Date  . INCISION AND DRAINAGE ABSCESS N/A 09/23/2018   Procedure: INCISION AND DRAINAGE PERIRECTAL ABSCESS;  Surgeon: Ileana Roup, MD;  Location: [place];  Service: General;  Laterality: N/A;  . INCISION AND DRAINAGE PERIRECTAL ABSCESS N/A 01/28/2018   Procedure: IRRIGATION AND DEBRIDEMENT PERIRECTAL  ABSCESS;  Surgeon: Kinsinger, Arta Bruce, MD;  Location: [place];  Service: General;  Laterality: N/A;  . NO PAST SURGERIES      Family History    Problem Relation Age of Onset  . Diabetes Mother   . Hypertension Mother   . Hypertension Father   . Diabetes Sister   . Hypertension Sister   . Hypertension Brother   . Hypertension Sister     Social History Reviewed with no changes to be made today.   Outpatient Medications Prior to Visit  Medication Sig Dispense Refill  . acetaminophen (TYLENOL) 500 MG tablet Take 2 tablets (1,000 mg total) by mouth every 6 (six) hours. 30 tablet 0  . amLODipine (NORVASC) 10 MG tablet Take 1 tablet (10 mg total) by mouth daily. 90 tablet 0  . ibuprofen (ADVIL) 200 MG tablet Take 200 mg by mouth every 6 (six) hours as needed.    Marland Kitchen losartan (COZAAR) 100 MG tablet Take 1 tablet (100 mg total) by mouth daily. Please make PCP appt. 30 tablet 0  . omeprazole (PRILOSEC) 20 MG capsule TAKE 1 CAPSULE BY MOUTH DAILY. 90 capsule 0  . nicotine (NICODERM CQ - DOSED IN MG/24 HOURS) 14 mg/24hr patch Place 1 patch (14 mg total) onto the skin daily. (Patient not taking: Reported on 02/18/2018) 36 patch 0  . oxyCODONE (OXY IR/ROXICODONE) 5 MG immediate release tablet Take 1-2 tablets (5-10 mg total) by mouth every 4 (four) hours as needed for moderate pain. (Patient not taking: Reported on 04/20/2019) 30 tablet 0   No facility-administered medications prior to visit.    No Known Allergies     Objective:    BP (!) 150/97 (BP Location: Right Arm, Patient Position: Sitting, Cuff Size: Small)   Pulse 96   Temp (!) 96.8 F (36 C) (Temporal)   Ht '5\' 8"'  (1.727 m)   Wt 111 lb (50.3 kg)   SpO2 100%   BMI 16.88 kg/m  Wt Readings from Last 3 Encounters:  04/20/19 111 lb (50.3 kg)  01/16/19 115 lb (52.2 kg)  09/23/18 109 lb (49.4 kg)    Physical Exam Vitals and nursing note reviewed.  Constitutional:      Appearance: He is well-developed.  HENT:     Head: Normocephalic and atraumatic.  Cardiovascular:     Rate and Rhythm: Normal rate and regular rhythm.     Heart sounds: Normal heart sounds. No murmur. No  friction rub. No gallop.   Pulmonary:     Effort: Pulmonary effort is normal. No tachypnea or respiratory distress.     Breath sounds: Normal breath sounds. No decreased breath sounds, wheezing, rhonchi or rales.  Chest:     Chest wall: No tenderness.  Abdominal:     General: Bowel sounds are normal.     Palpations: Abdomen is soft.  Musculoskeletal:        General: Normal range of motion.     Cervical back: Normal range of motion.  Skin:    General: Skin is warm and dry.  Neurological:     Mental Status: He is alert and oriented to person, place, and time.     Coordination: Coordination normal.  Psychiatric:        Behavior: Behavior normal. Behavior is cooperative.        Thought Content: Thought content normal.        Judgment: Judgment normal.        Patient has been counseled extensively about nutrition and exercise as  well as the importance of adherence with medications and regular follow-up. The patient was given clear instructions to go to ER or return to medical center if symptoms don't improve, worsen or new problems develop. The patient verbalized understanding.   Follow-up: Return in about 3 months (around 07/18/2019).   Gildardo Pounds, FNP-BC [facility] [place], [place]   04/20/2019, 11:22 AM

## 2019-04-21 LAB — CMP14+EGFR
ALT: 6 IU/L (ref 0–44)
AST: 16 IU/L (ref 0–40)
Albumin/Globulin Ratio: 1 — ABNORMAL LOW (ref 1.2–2.2)
Albumin: 4 g/dL (ref 3.8–4.9)
Alkaline Phosphatase: 97 IU/L (ref 39–117)
BUN/Creatinine Ratio: 7 — ABNORMAL LOW (ref 9–20)
BUN: 9 mg/dL (ref 6–24)
Bilirubin Total: 0.4 mg/dL (ref 0.0–1.2)
CO2: 21 mmol/L (ref 20–29)
Calcium: 9.5 mg/dL (ref 8.7–10.2)
Chloride: 102 mmol/L (ref 96–106)
Creatinine, Ser: 1.38 mg/dL — ABNORMAL HIGH (ref 0.76–1.27)
GFR calc Af Amer: 66 mL/min/{1.73_m2} (ref >59)
GFR calc non Af Amer: 57 mL/min/{1.73_m2} — ABNORMAL LOW (ref >59)
Globulin, Total: 4.1 g/dL (ref 1.5–4.5)
Glucose: 105 mg/dL — ABNORMAL HIGH (ref 65–99)
Potassium: 4.7 mmol/L (ref 3.5–5.2)
Sodium: 139 mmol/L (ref 134–144)
Total Protein: 8.1 g/dL (ref 6.0–8.5)

## 2019-04-21 LAB — CBC
Hematocrit: 33.2 % — ABNORMAL LOW (ref 37.5–51.0)
Hemoglobin: 9.7 g/dL — ABNORMAL LOW (ref 13.0–17.7)
MCH: 23.2 pg — ABNORMAL LOW (ref 26.6–33.0)
MCHC: 29.2 g/dL — ABNORMAL LOW (ref 31.5–35.7)
MCV: 79 fL (ref 79–97)
Platelets: 478 10*3/uL — ABNORMAL HIGH (ref 150–450)
RBC: 4.19 x10E6/uL (ref 4.14–5.80)
RDW: 18.1 % — ABNORMAL HIGH (ref 11.6–15.4)
WBC: 8.8 10*3/uL (ref 3.4–10.8)

## 2019-04-21 LAB — TSH: TSH: 2.12 u[IU]/mL (ref 0.450–4.500)

## 2019-04-26 ENCOUNTER — Telehealth: Payer: Self-pay | Admitting: Nurse Practitioner

## 2019-04-26 ENCOUNTER — Telehealth: Payer: Self-pay | Admitting: *Deleted

## 2019-04-26 NOTE — Telephone Encounter (Signed)
Patient called requesting lab results. Patient was given lab results patient verbalized understanding and had no further questions.

## 2019-04-26 NOTE — Telephone Encounter (Signed)
MA UTR patient and no VM option.

## 2019-04-26 NOTE — Telephone Encounter (Signed)
-----   Message from Claiborne Rigg, NP sent at 04/23/2019 10:24 AM EST ----- Labs still show anemia. I have you to specialists for further evaluation. They will call you to schedule. Kidney function is stable. Thyroid level is normal.

## 2019-05-03 ENCOUNTER — Other Ambulatory Visit: Payer: Self-pay | Admitting: Nurse Practitioner

## 2019-05-03 DIAGNOSIS — I10 Essential (primary) hypertension: Secondary | ICD-10-CM

## 2019-05-04 MED FILL — AMLODIPINE BESYLATE 10 MG T: 10 | 30 days supply | Qty: 30 | Fill #0

## 2019-05-14 ENCOUNTER — Other Ambulatory Visit: Payer: Self-pay

## 2019-05-14 ENCOUNTER — Ambulatory Visit: Payer: Self-pay | Attending: Nurse Practitioner

## 2019-05-14 MED FILL — ?AMLODIPINE BESYL 10MG TABL: 10 | 30 days supply | Qty: 30 | Fill #0

## 2019-05-18 ENCOUNTER — Other Ambulatory Visit (INDEPENDENT_AMBULATORY_CARE_PROVIDER_SITE_OTHER): Payer: Self-pay

## 2019-05-18 ENCOUNTER — Ambulatory Visit (INDEPENDENT_AMBULATORY_CARE_PROVIDER_SITE_OTHER): Payer: Self-pay | Admitting: Gastroenterology

## 2019-05-18 ENCOUNTER — Other Ambulatory Visit: Payer: Self-pay

## 2019-05-18 ENCOUNTER — Encounter: Payer: Self-pay | Admitting: Gastroenterology

## 2019-05-18 VITALS — BP 134/78 | HR 88 | Temp 98.7°F | Ht 68.0 in | Wt 117.4 lb

## 2019-05-18 DIAGNOSIS — K629 Disease of anus and rectum, unspecified: Secondary | ICD-10-CM

## 2019-05-18 DIAGNOSIS — D649 Anemia, unspecified: Secondary | ICD-10-CM

## 2019-05-18 LAB — IBC + FERRITIN
Ferritin: 8.5 ng/mL — ABNORMAL LOW (ref 22.0–322.0)
Iron: 9 ug/dL — ABNORMAL LOW (ref 42–165)
Saturation Ratios: 1.9 % — ABNORMAL LOW (ref 20.0–50.0)
Transferrin: 334 mg/dL (ref 212.0–360.0)

## 2019-05-18 LAB — FOLATE: Folate: 8.3 ng/mL (ref >5.9)

## 2019-05-18 LAB — VITAMIN B12: Vitamin B-12: 169 pg/mL — ABNORMAL LOW (ref 211–911)

## 2019-05-18 NOTE — Patient Instructions (Addendum)
If you are age 57 or older, your body mass index should be between 23-30. Your Body mass index is 17.85 kg/m. If this is out of the aforementioned range listed, please consider follow up with your Primary Care Provider.  If you are age 81 or younger, your body mass index should be between 19-25. Your Body mass index is 17.85 kg/m. If this is out of the aformentioned range listed, please consider follow up with your Primary Care Provider.   Your provider has requested that you go to the basement level for lab work before leaving today. Press "B" on the elevator. The lab is located at the first door on the left as you exit the elevator.  We would like to schedule you for a colon/EGD. Please complete the form for the financial assistance program.   We will send your records to [facility]. They will reach out to you to schedule an appointment.   It was a pleasure to see you today!  Dr. Myrtie Neither

## 2019-05-18 NOTE — Progress Notes (Signed)
[name] Progress Note  Chief Complaint: Anemia  Subjective  History: Seen December 2018 for esophageal dysphagia and weight loss (unclear if those 2 were related).  He was noted to have poor dentition.  EGD scheduled, but he canceled the following week.  Staff followed up by phone afterwards, and patient was also given information for local transport service to hopefully allow him to have his procedure.  He did not call back to reschedule.  Erik Clay was referred again by primary care for anemia.  As before, he is a limited historian.  The previously described dysphagia no longer seems to be an issue.  He denies nausea, vomiting, odynophagia or ongoing weight loss as near as he can determine.  (Weight is up 2 pounds from his 2018 visit with me)  His bowel habits are regular, and he does not believe he has seen any rectal bleeding.  Ever, on further consideration, he says he might occasionally see some blood on the toilet paper.  ROS: Cardiovascular:  no chest pain Respiratory: no dyspnea Remainder of systems negative except as above  The patient's Past Medical, Family and Social History were reviewed and are on file in the EMR. Past Medical History:  Diagnosis Date  . GERD (gastroesophageal reflux disease)   . Perirectal abscess    Past Surgical History:  Procedure Laterality Date  . INCISION AND DRAINAGE ABSCESS N/A 09/23/2018   Procedure: INCISION AND DRAINAGE PERIRECTAL ABSCESS;  Surgeon: Andria Meuse, MD;  Location: [facility];  Service: General;  Laterality: N/A;  . INCISION AND DRAINAGE PERIRECTAL ABSCESS N/A 01/28/2018   Procedure: IRRIGATION AND DEBRIDEMENT PERIRECTAL ABSCESS;  Surgeon: Sheliah Hatch De Blanch, MD;  Location: [facility] OR;  Service: General;  Laterality: N/A;  . NO PAST SURGERIES      Erik Clay lost his job and health insurance at the end of last year. Continues to smoke  Objective:  Med list reviewed  Current Outpatient Medications:  .   acetaminophen (TYLENOL) 500 MG tablet, Take 2 tablets (1,000 mg total) by mouth every 6 (six) hours., Disp: 30 tablet, Rfl: 0 .  amLODipine (NORVASC) 10 MG tablet, TAKE 1 TABLET (10 MG TOTAL) BY MOUTH DAILY., Disp: 30 tablet, Rfl: 2 .  ibuprofen (ADVIL) 200 MG tablet, Take 200 mg by mouth every 6 (six) hours as needed., Disp: , Rfl:  .  losartan (COZAAR) 100 MG tablet, Take 1 tablet (100 mg total) by mouth daily. Please make PCP appt., Disp: 30 tablet, Rfl: 0 .  omeprazole (PRILOSEC) 20 MG capsule, TAKE 1 CAPSULE BY MOUTH DAILY., Disp: 90 capsule, Rfl: 0 .  nicotine (NICODERM CQ - DOSED IN MG/24 HOURS) 14 mg/24hr patch, Place 1 patch (14 mg total) onto the skin daily. (Patient not taking: Reported on 05/18/2019), Disp: 36 patch, Rfl: 0   Vital signs in last 24 hrs: Vitals:   05/18/19 1413  BP: 134/78  Pulse: 88  Temp: 98.7 F (37.1 C)    Physical Exam  Thin, chronically ill-appearing man.  Distracted, poor eye contact  HEENT: sclera anicteric, oral mucosa moist without lesions.  Poor dentition  Neck: supple, no thyromegaly, JVD or lymphadenopathy  Cardiac: RRR without murmurs, S1S2 heard, no peripheral edema  Pulm: clear to auscultation bilaterally, normal RR and effort noted  Abdomen: soft, no tenderness, with active bowel sounds. No guarding or palpable hepatosplenomegaly.  Skin; warm and dry, no jaundice or rash Rectal: Either inflamed grade 3 hemorrhoid tissue or polypoid lesion, was able to be reduced  but then prolapsed again.  He was not particularly tolerant of the rectal exam.  I did not do a heme test since the area in question was clearly inflamed and caused some scant blood on the glove.  Postsurgical changes left perianal area, firm mucosal tissue without overlying erythema or tenderness.  No opening or drainage  Labs:  CBC Latest Ref Rng & Units 04/20/2019 09/26/2018 09/25/2018  WBC 3.4 - 10.8 x10E3/uL 8.8 7.1 10.3  Hemoglobin 13.0 - 17.7 g/dL 9.7(L) 9.4(L) 9.2(L)    Hematocrit 37.5 - 51.0 % 33.2(L) 30.6(L) 30.3(L)  Platelets 150 - 450 x10E3/uL 478(H) 285 255  MCV 79  Hemoglobin normal member 2019, then between 11 and 13 September 2018.  Of note, the anemia that developed in July was at the time of a perirectal abscess requiring surgical drainage.  CMP Latest Ref Rng & Units 04/20/2019 09/26/2018 09/25/2018  Glucose 65 - 99 mg/dL 105(H) 77 93  BUN 6 - 24 mg/dL 9 9 11   Creatinine 0.76 - 1.27 mg/dL 1.38(H) 1.42(H) 2.01(H)  Sodium 134 - 144 mmol/L 139 135 136  Potassium 3.5 - 5.2 mmol/L 4.7 3.5 3.5  Chloride 96 - 106 mmol/L 102 102 106  CO2 20 - 29 mmol/L 21 24 21(L)  Calcium 8.7 - 10.2 mg/dL 9.5 8.2(L) 8.4(L)  Total Protein 6.0 - 8.5 g/dL 8.1 - -  Total Bilirubin 0.0 - 1.2 mg/dL 0.4 - -  Alkaline Phos 39 - 117 IU/L 97 - -  AST 0 - 40 IU/L 16 - -  ALT 0 - 44 IU/L 6 - -   Most recent GFR over 60  __No iron levels, O67 or folic acid on file   _________________________________________ Radiologic studies:   ____________________________________________ Other:   _____________________________________________ Assessment & Plan  Assessment: Encounter Diagnoses  Name Primary?  . Normocytic anemia Yes  . Anal lesion    Chronic normocytic anemia, no lab anemia work-up done to date determine if this is even iron deficient.  Anorectal lesion, I cannot quite tell what it is.  I would like him to have an EGD and colonoscopy, but he says he cannot do that until he is approved for his [employer] card care. At the very least, I would like colorectal surgery to evaluate this exam finding.  I have referred him to see Dr. Nadeen Landau, who performed his buttock abscess surgery last year.  Plan: In addition to above, iron studies, E72 and folic acid level   30 minutes were spent on this encounter (including chart review, history/exam, counseling/coordination of care, and documentation)  Nelida Meuse III

## 2019-05-20 ENCOUNTER — Telehealth: Payer: Self-pay

## 2019-05-20 NOTE — Telephone Encounter (Signed)
Records faxed to [facility] for new patient appointment for a rectal lesion. Will await appointment information

## 2019-05-25 ENCOUNTER — Telehealth: Payer: Self-pay | Admitting: Nurse Practitioner

## 2019-05-25 ENCOUNTER — Other Ambulatory Visit: Payer: Self-pay | Admitting: *Deleted

## 2019-05-25 MED ORDER — IRON (FERROUS SULFATE) 325 (65 FE) MG PO TABS: 1.0000 | ORAL_TABLET | Freq: Two times a day (BID) | ORAL | 0 refills | Status: DC

## 2019-05-25 NOTE — Telephone Encounter (Signed)
Pt was sent a letter from financial dept. Inform them, that the application they submitted was incomplete, since they were missing some documentation at the time of the appointment, Pt need to reschedule and resubmit all new papers and application for [employer] and [facility], P.S. old documents has been sent back by mail to the Pt and Pt. need to make a new appt. 

## 2019-05-26 MED FILL — FERROUS SULFATE 325 MG TAB: 325 (65 FE) | 30 days supply | Qty: 60 | Fill #0

## 2019-05-28 ENCOUNTER — Other Ambulatory Visit: Payer: Self-pay | Admitting: Nurse Practitioner

## 2019-05-28 ENCOUNTER — Telehealth: Payer: Self-pay | Admitting: *Deleted

## 2019-05-28 ENCOUNTER — Other Ambulatory Visit: Payer: Self-pay | Admitting: Family Medicine

## 2019-05-28 DIAGNOSIS — I10 Essential (primary) hypertension: Secondary | ICD-10-CM

## 2019-05-28 DIAGNOSIS — K219 Gastro-esophageal reflux disease without esophagitis: Secondary | ICD-10-CM

## 2019-05-28 NOTE — Telephone Encounter (Signed)
Entered in error

## 2019-05-29 ENCOUNTER — Other Ambulatory Visit: Payer: Self-pay | Admitting: Nurse Practitioner

## 2019-05-29 DIAGNOSIS — E538 Deficiency of other specified B group vitamins: Secondary | ICD-10-CM

## 2019-05-29 MED ORDER — CYANOCOBALAMIN 1000 MCG/ML IJ SOLN: 1000.0000 ug | INTRAMUSCULAR | Status: AC

## 2019-05-31 ENCOUNTER — Encounter: Payer: Self-pay | Admitting: *Deleted

## 2019-05-31 MED FILL — LOSARTAN POTASSIUM 100 MG T: 100 | 30 days supply | Qty: 30 | Fill #0

## 2019-05-31 MED FILL — OMEPRAZOLE 20 MG CAP: 20 | 30 days supply | Qty: 30 | Fill #0

## 2019-06-03 NOTE — Progress Notes (Signed)
Attempt to reach patient to schedule b12 monthly injection. No answer and will mail out a letter to reach patient.

## 2019-06-30 ENCOUNTER — Other Ambulatory Visit: Payer: Self-pay

## 2019-06-30 ENCOUNTER — Ambulatory Visit: Payer: Self-pay | Attending: Nurse Practitioner

## 2019-06-30 DIAGNOSIS — E538 Deficiency of other specified B group vitamins: Secondary | ICD-10-CM

## 2019-06-30 MED ORDER — CYANOCOBALAMIN 1000 MCG/ML IJ SOLN
1000.0000 ug | Freq: Once | INTRAMUSCULAR | Status: AC
  Administered 2019-06-30: 10:00:00 1000 ug via INTRAMUSCULAR

## 2019-06-30 NOTE — Progress Notes (Signed)
Patient arrived at the clinic to get his first B-12 injection.  Patient was given injection in right Deltoid muscle, he tolerated the shot well.  Patient was informed that he will be notified by his nurse if any follow up shots are needed.

## 2019-07-06 MED FILL — AMLODIPINE BESYLATE 10 MG T: 10 | 30 days supply | Qty: 30 | Fill #1

## 2019-07-16 ENCOUNTER — Other Ambulatory Visit: Payer: Self-pay

## 2019-07-16 ENCOUNTER — Ambulatory Visit: Payer: Self-pay | Attending: Nurse Practitioner | Admitting: Nurse Practitioner

## 2019-07-23 ENCOUNTER — Ambulatory Visit: Payer: Self-pay

## 2019-07-23 NOTE — Telephone Encounter (Signed)
Per [place] at [facility] patient declined the referral.

## 2019-07-26 ENCOUNTER — Ambulatory Visit: Payer: 59 | Attending: Nurse Practitioner

## 2019-07-26 ENCOUNTER — Other Ambulatory Visit: Payer: Self-pay

## 2019-07-26 VITALS — BP 130/90 | HR 75 | Temp 98.1°F | Resp 17

## 2019-07-26 DIAGNOSIS — E538 Deficiency of other specified B group vitamins: Secondary | ICD-10-CM

## 2019-07-26 MED ORDER — CYANOCOBALAMIN 1000 MCG/ML IJ SOLN
1000.0000 ug | Freq: Once | INTRAMUSCULAR | Status: AC
  Administered 2019-07-26: 1000 ug via INTRAMUSCULAR

## 2019-07-26 NOTE — Progress Notes (Signed)
Pt is here for his B12 shots as instructed by PCP and  Gastro for normocytic anemia.   Injection well tolerated, no reaction post administration noted.  Last B12 completed lab was done on 05/18/2019  Pt has upcoming appt with PCP on 08/03/2019 /

## 2019-08-03 ENCOUNTER — Other Ambulatory Visit: Payer: Self-pay

## 2019-08-03 ENCOUNTER — Ambulatory Visit: Payer: 59 | Attending: Nurse Practitioner | Admitting: Nurse Practitioner

## 2019-08-03 ENCOUNTER — Encounter: Payer: Self-pay | Admitting: Nurse Practitioner

## 2019-08-03 VITALS — BP 135/84 | HR 100 | Temp 97.7°F | Ht 68.0 in | Wt 112.0 lb

## 2019-08-03 DIAGNOSIS — I10 Essential (primary) hypertension: Secondary | ICD-10-CM | POA: Diagnosis not present

## 2019-08-03 DIAGNOSIS — K219 Gastro-esophageal reflux disease without esophagitis: Secondary | ICD-10-CM

## 2019-08-03 DIAGNOSIS — E538 Deficiency of other specified B group vitamins: Secondary | ICD-10-CM

## 2019-08-03 DIAGNOSIS — F17200 Nicotine dependence, unspecified, uncomplicated: Secondary | ICD-10-CM | POA: Diagnosis not present

## 2019-08-03 DIAGNOSIS — D649 Anemia, unspecified: Secondary | ICD-10-CM

## 2019-08-03 MED ORDER — AMLODIPINE BESYLATE 10 MG PO TABS: 10.0000 mg | ORAL_TABLET | Freq: Every day | ORAL | 1 refills | Status: DC

## 2019-08-03 MED ORDER — LOSARTAN POTASSIUM 100 MG PO TABS: 100.0000 mg | ORAL_TABLET | Freq: Every day | ORAL | 1 refills | Status: DC

## 2019-08-03 MED FILL — AMLODIPINE BESYLATE 10 MG T: 10 | 30 days supply | Qty: 30 | Fill #0

## 2019-08-03 NOTE — Patient Instructions (Signed)
I recommend the PFIZER or MODERNA

## 2019-08-03 NOTE — Progress Notes (Signed)
Assessment & Plan:  Erik Clay was seen today for blood pressure check.  Diagnoses and all orders for this visit:  Essential hypertension -     amLODipine (NORVASC) 10 MG tablet; Take 1 tablet (10 mg total) by mouth daily. -     losartan (COZAAR) 100 MG tablet; Take 1 tablet (100 mg total) by mouth daily. Continue all antihypertensives as prescribed.  Remember to bring in your blood pressure log with you for your follow up appointment.  DASH/Mediterranean Diets are healthier choices for HTN.   GERD without esophagitis INSTRUCTIONS: Avoid GERD Triggers: acidic, spicy or fried foods, caffeine, coffee, sodas,  alcohol and chocolate.   Tobacco dependence Erik Clay was counseled on the dangers of tobacco use, and was advised to quit. Reviewed strategies to maximize success, including removing cigarettes and smoking materials from environment, stress management and support of family/friends as well as pharmacological alternatives including: Wellbutrin, Chantix, Nicotine patch, Nicotine gum or lozenges. Smoking cessation support: smoking cessation hotline: 1-800-QUIT-NOW.  Smoking cessation classes are also available through [facility] and [facility]. Call 7152068943 or visit our website at [email or web address].   A total of 2 minutes was spent on counseling for smoking cessation and Erik Clay is ready to quit. Declines nicotine patches. Smoking 4-5 cigarettes per day.     Patient has been counseled on age-appropriate routine health concerns for screening and prevention. These are reviewed and up-to-date. Referrals have been placed accordingly. Immunizations are up-to-date or declined.    Subjective:   Chief Complaint  Patient presents with  . Blood Pressure Check    Pt. is here for a blood pressure check.    HPI Erik Clay 59 y.o. male presents to office today for HTN  He is overdue B12 injections.    Essential Hypertension Well controlled. Taking amlodipine 10 mg daily  and losartan 100 mg daily. Denies chest pain, shortness of breath, palpitations, lightheadedness, dizziness, headaches or BLE edema.                     BP Readings from Last 3 Encounters:  08/03/19 135/84  07/26/19 130/90  05/18/19 134/78    Review of Systems  Constitutional: Negative for fever, malaise/fatigue and weight loss.  HENT: Negative.  Negative for nosebleeds.   Eyes: Negative.  Negative for blurred vision, double vision and photophobia.  Respiratory: Negative.  Negative for cough and shortness of breath.   Cardiovascular: Negative.  Negative for chest pain, palpitations and leg swelling.  Gastrointestinal: Negative.  Negative for heartburn, nausea and vomiting.  Musculoskeletal: Negative.  Negative for myalgias.  Neurological: Negative.  Negative for dizziness, focal weakness, seizures and headaches.  Psychiatric/Behavioral: Negative.  Negative for suicidal ideas.    Past Medical History:  Diagnosis Date  . GERD (gastroesophageal reflux disease)   . Perirectal abscess     Past Surgical History:  Procedure Laterality Date  . INCISION AND DRAINAGE ABSCESS N/A 09/23/2018   Procedure: INCISION AND DRAINAGE PERIRECTAL ABSCESS;  Surgeon: Andria Meuse, MD;  Location: [facility];  Service: General;  Laterality: N/A;  . INCISION AND DRAINAGE PERIRECTAL ABSCESS N/A 01/28/2018   Procedure: IRRIGATION AND DEBRIDEMENT PERIRECTAL ABSCESS;  Surgeon: Sheliah Hatch De Blanch, MD;  Location: [facility];  Service: General;  Laterality: N/A;  . NO PAST SURGERIES      Family History  Problem Relation Age of Onset  . Diabetes Mother   . Hypertension Mother   . Hypertension Father   . Diabetes Sister   .  Hypertension Sister   . Hypertension Brother   . Hypertension Sister     Social History Reviewed with no changes to be made today.   Outpatient Medications Prior to Visit  Medication Sig Dispense Refill  . acetaminophen (TYLENOL) 500 MG tablet Take 2 tablets (1,000 mg total) by mouth  every 6 (six) hours. 30 tablet 0  . ibuprofen (ADVIL) 200 MG tablet Take 200 mg by mouth every 6 (six) hours as needed.    . Iron, Ferrous Sulfate, 325 (65 Fe) MG TABS Take 1 tablet by mouth in the morning and at bedtime. 60 tablet 0  . omeprazole (PRILOSEC) 20 MG capsule TAKE 1 CAPSULE BY MOUTH DAILY. 30 capsule 2  . amLODipine (NORVASC) 10 MG tablet TAKE 1 TABLET (10 MG TOTAL) BY MOUTH DAILY. 30 tablet 2  . losartan (COZAAR) 100 MG tablet Take 1 tablet (100 mg total) by mouth daily. 30 tablet 2  . nicotine (NICODERM CQ - DOSED IN MG/24 HOURS) 14 mg/24hr patch Place 1 patch (14 mg total) onto the skin daily. (Patient not taking: Reported on 05/18/2019) 36 patch 0   No facility-administered medications prior to visit.    No Known Allergies     Objective:    BP 135/84 (BP Location: Left Arm, Patient Position: Sitting, Cuff Size: Small)   Pulse 100   Temp 97.7 F (36.5 C) (Temporal)   Ht 5\' 8"  (1.727 m)   Wt 112 lb (50.8 kg)   SpO2 99%   BMI 17.03 kg/m  Wt Readings from Last 3 Encounters:  08/03/19 112 lb (50.8 kg)  05/18/19 117 lb 6.4 oz (53.3 kg)  04/20/19 111 lb (50.3 kg)    Physical Exam Vitals and nursing note reviewed.  Constitutional:      Appearance: He is well-developed.  HENT:     Head: Normocephalic and atraumatic.  Eyes:     Extraocular Movements:     Right eye: Abnormal extraocular motion and nystagmus present.     Left eye: Abnormal extraocular motion and nystagmus present.  Cardiovascular:     Rate and Rhythm: Normal rate and regular rhythm.     Heart sounds: Normal heart sounds. No murmur. No friction rub. No gallop.   Pulmonary:     Effort: Pulmonary effort is normal. No tachypnea or respiratory distress.     Breath sounds: Normal breath sounds. No decreased breath sounds, wheezing, rhonchi or rales.  Chest:     Chest wall: No tenderness.  Abdominal:     General: Bowel sounds are normal.     Palpations: Abdomen is soft.  Musculoskeletal:         General: Normal range of motion.     Cervical back: Normal range of motion.  Skin:    General: Skin is warm and dry.  Neurological:     Mental Status: He is alert and oriented to person, place, and time.     Coordination: Coordination normal.  Psychiatric:        Behavior: Behavior normal. Behavior is cooperative.        Thought Content: Thought content normal.        Judgment: Judgment normal.          Patient has been counseled extensively about nutrition and exercise as well as the importance of adherence with medications and regular follow-up. The patient was given clear instructions to go to ER or return to medical center if symptoms don't improve, worsen or new problems develop. The patient verbalized understanding.  Follow-up: Return in about 3 months (around 11/03/2019).   Claiborne Rigg, FNP-BC [facility] [place], [place] [phone]   08/03/2019, 4:08 PM

## 2019-08-04 LAB — CMP14+EGFR
ALT: 6 IU/L (ref 0–44)
AST: 11 IU/L (ref 0–40)
Albumin/Globulin Ratio: 1.1 — ABNORMAL LOW (ref 1.2–2.2)
Albumin: 4.4 g/dL (ref 3.8–4.9)
Alkaline Phosphatase: 100 IU/L (ref 48–121)
BUN/Creatinine Ratio: 11 (ref 9–20)
BUN: 15 mg/dL (ref 6–24)
Bilirubin Total: 0.2 mg/dL (ref 0.0–1.2)
CO2: 21 mmol/L (ref 20–29)
Calcium: 9.4 mg/dL (ref 8.7–10.2)
Chloride: 104 mmol/L (ref 96–106)
Creatinine, Ser: 1.35 mg/dL — ABNORMAL HIGH (ref 0.76–1.27)
GFR calc Af Amer: 67 mL/min/{1.73_m2} (ref >59)
GFR calc non Af Amer: 58 mL/min/{1.73_m2} — ABNORMAL LOW (ref >59)
Globulin, Total: 4 g/dL (ref 1.5–4.5)
Glucose: 113 mg/dL — ABNORMAL HIGH (ref 65–99)
Potassium: 4.7 mmol/L (ref 3.5–5.2)
Sodium: 140 mmol/L (ref 134–144)
Total Protein: 8.4 g/dL (ref 6.0–8.5)

## 2019-08-04 LAB — CBC
Hematocrit: 34.3 % — ABNORMAL LOW (ref 37.5–51.0)
Hemoglobin: 9.8 g/dL — ABNORMAL LOW (ref 13.0–17.7)
MCH: 22.6 pg — ABNORMAL LOW (ref 26.6–33.0)
MCHC: 28.6 g/dL — ABNORMAL LOW (ref 31.5–35.7)
MCV: 79 fL (ref 79–97)
Platelets: 273 10*3/uL (ref 150–450)
RBC: 4.34 x10E6/uL (ref 4.14–5.80)
RDW: 18.2 % — ABNORMAL HIGH (ref 11.6–15.4)
WBC: 7.7 10*3/uL (ref 3.4–10.8)

## 2019-08-04 LAB — VITAMIN B12: Vitamin B-12: 705 pg/mL (ref 232–1245)

## 2019-08-10 ENCOUNTER — Ambulatory Visit: Payer: 59

## 2019-08-24 MED FILL — LOSARTAN POTASSIUM 100 MG T: 100 | 30 days supply | Qty: 30 | Fill #2

## 2019-08-30 MED FILL — ?AMLODIPINE BESYL 10MG TABL: 10 | 30 days supply | Qty: 30 | Fill #1

## 2019-08-31 ENCOUNTER — Encounter (HOSPITAL_COMMUNITY): Payer: Self-pay

## 2019-08-31 ENCOUNTER — Ambulatory Visit (HOSPITAL_COMMUNITY)
Admission: EM | Admit: 2019-08-31 | Discharge: 2019-08-31 | Disposition: A | Discharge status: Home / Self Care | Payer: 59 | Attending: Physician Assistant | Admitting: Physician Assistant

## 2019-08-31 ENCOUNTER — Other Ambulatory Visit: Payer: Self-pay

## 2019-08-31 DIAGNOSIS — L0231 Cutaneous abscess of buttock: Secondary | ICD-10-CM | POA: Diagnosis not present

## 2019-08-31 MED ORDER — SULFAMETHOXAZOLE-TRIMETHOPRIM 800-160 MG PO TABS: 1.0000 | ORAL_TABLET | Freq: Two times a day (BID) | ORAL | 0 refills | Status: AC

## 2019-08-31 MED ORDER — LIDOCAINE-EPINEPHRINE (PF) 2 %-1:200000 IJ SOLN
INTRAMUSCULAR | Status: AC
  Filled 2019-08-31: qty 20

## 2019-08-31 NOTE — ED Provider Notes (Signed)
[facility]    CSN: [id number] Arrival date & time: 08/31/19  1817      History   Chief Complaint Chief Complaint  Patient presents with  . Abscess    HPI Erik Clay is a 59 y.o. male.   Patient with history of gluteal and perirectal abscesses presents for an abscess on his left buttocks.  He reports this is been developing over the last week.  He reports it is quite painful and swollen.  He denies any fevers and chills.  He reports he has had abscesses in this area before.  He has not had 1 since last year.  He reports when he was seen in October 2020 antibiotics seem to help prevent drainage at that time.  He reports resolution of the symptoms after that.  He has not had them return until recently.     Past Medical History:  Diagnosis Date  . GERD (gastroesophageal reflux disease)   . Perirectal abscess     Patient Active Problem List   Diagnosis Date Noted  . Perirectal abscess 09/23/2018  . Essential hypertension 12/08/2016  . Gastroesophageal reflux disease 12/08/2016    Past Surgical History:  Procedure Laterality Date  . INCISION AND DRAINAGE ABSCESS N/A 09/23/2018   Procedure: INCISION AND DRAINAGE PERIRECTAL ABSCESS;  Surgeon: Andria Meuse, MD;  Location: [facility];  Service: General;  Laterality: N/A;  . INCISION AND DRAINAGE PERIRECTAL ABSCESS N/A 01/28/2018   Procedure: IRRIGATION AND DEBRIDEMENT PERIRECTAL ABSCESS;  Surgeon: Sheliah Hatch De Blanch, MD;  Location: [facility];  Service: General;  Laterality: N/A;  . NO PAST SURGERIES         Home Medications    Prior to Admission medications   Medication Sig Start Date End Date Taking? Authorizing Provider  acetaminophen (TYLENOL) 500 MG tablet Take 2 tablets (1,000 mg total) by mouth every 6 (six) hours. 09/26/18   Luretha Murphy, MD  amLODipine (NORVASC) 10 MG tablet Take 1 tablet (10 mg total) by mouth daily. 08/03/19 11/01/19  Claiborne Rigg, NP  ibuprofen (ADVIL) 200 MG tablet Take  200 mg by mouth every 6 (six) hours as needed.    [provider]  Iron, Ferrous Sulfate, 325 (65 Fe) MG TABS Take 1 tablet by mouth in the morning and at bedtime. 05/25/19   Sherrilyn Rist, MD  losartan (COZAAR) 100 MG tablet Take 1 tablet (100 mg total) by mouth daily. 08/03/19 11/01/19  Claiborne Rigg, NP  nicotine (NICODERM CQ - DOSED IN MG/24 HOURS) 14 mg/24hr patch Place 1 patch (14 mg total) onto the skin daily. Patient not taking: Reported on 05/18/2019 09/01/17   Claiborne Rigg, NP  omeprazole (PRILOSEC) 20 MG capsule TAKE 1 CAPSULE BY MOUTH DAILY. 05/31/19   Hoy Register, MD  sulfamethoxazole-trimethoprim (BACTRIM DS) 800-160 MG tablet Take 1 tablet by mouth 2 (two) times daily for 10 days. 08/31/19 09/10/19  Janson Lamar, Veryl Speak, PA-C    Family History Family History  Problem Relation Age of Onset  . Diabetes Mother   . Hypertension Mother   . Hypertension Father   . Diabetes Sister   . Hypertension Sister   . Hypertension Brother   . Hypertension Sister     Social History Social History   Tobacco Use  . Smoking status: Current Every Day Smoker    Packs/day: 0.50    Types: Cigarettes  . Smokeless tobacco: Never Used  Vaping Use  . Vaping Use: Never used  Substance Use  Topics  . Alcohol use: Not Currently    Alcohol/week: 1.0 standard drink    Types: 1 Cans of beer per week    Comment: 1-2 per day  . Drug use: No     Allergies   Patient has no known allergies.   Review of Systems Review of Systems   Physical Exam Triage Vital Signs ED Triage Vitals  Enc Vitals Group     BP 08/31/19 1911 138/87     Pulse Rate 08/31/19 1911 88     Resp 08/31/19 1911 18     Temp 08/31/19 1911 98 F (36.7 C)     Temp Source 08/31/19 1911 Oral     SpO2 08/31/19 1911 98 %     Weight --      Height --      Head Circumference --      Peak Flow --      Pain Score 08/31/19 1909 8     Pain Loc --      Pain Edu? --      Excl. in GC? --    No data found.  Updated  Vital Signs BP 138/87 (BP Location: Left Arm)   Pulse 88   Temp 98 F (36.7 C) (Oral)   Resp 18   SpO2 98%   Visual Acuity Right Eye Distance:   Left Eye Distance:   Bilateral Distance:    Right Eye Near:   Left Eye Near:    Bilateral Near:     Physical Exam Vitals and nursing note reviewed.  Constitutional:      Appearance: Normal appearance.  Cardiovascular:     Rate and Rhythm: Normal rate.  Pulmonary:     Effort: Pulmonary effort is normal. No respiratory distress.  Genitourinary:   Skin:         Comments: 10.5 cm in diameter abscess of left buttocks.   Neurological:     Mental Status: He is alert.      UC Treatments / Results  Labs (all labs ordered are listed, but only abnormal results are displayed) Labs Reviewed  AEROBIC CULTURE (SUPERFICIAL SPECIMEN)    EKG   Radiology No results found.  Procedures Incision and Drainage  Date/Time: 08/31/2019 8:16 PM Performed by: Hermelinda Medicus, PA-C Authorized by: Hermelinda Medicus, PA-C   Consent:    Consent obtained:  Verbal   Consent given by:  Patient   Risks discussed:  Pain   Alternatives discussed:  Referral Location:    Type:  Abscess   Size:  10.5 cm   Location:  Lower extremity   Lower extremity location:  Buttock   Buttock location:  L buttock Pre-procedure details:    Skin preparation:  Antiseptic wash and Betadine Anesthesia (see MAR for exact dosages):    Anesthesia method:  Local infiltration   Local anesthetic:  Lidocaine 2% WITH epi Procedure details:    Needle aspiration: no     Incision types:  Single straight   Scalpel blade:  11   Wound management:  Probed and deloculated   Drainage:  Purulent and bloody   Drainage amount:  Copious   Packing materials:  1/4 in gauze   Amount 1/4":  20cm Post-procedure details:    Patient tolerance of procedure:  Tolerated well, no immediate complications Comments:     Copious drainage. Some concern following drainage with true ability to  appreciate abscess size and border for communication with rectum.  There was no stool in the abscess.   (  including critical care time)  Medications Ordered in UC Medications - No data to display  Initial Impression / Assessment and Plan / UC Course  I have reviewed the triage vital signs and the nursing notes.  Pertinent labs & imaging results that were available during my care of the patient were reviewed by me and considered in my medical decision making (see chart for details).     #Left buttocks abscess Patient is a 59 year old presenting with abscess to left buttocks.  Upon drainage was able to appreciate the depth of the pocket, concerned as close proximity to rectum for the medial border.  No clear communication between rectum and abscess however given history concern is there.  Discharge was extremely foul-smelling but no evidence of stool.  Wound culture sent.  Placed on Bactrim for 10 days and instructed to call general surgery tomorrow for evaluation as soon as possible.  Instructed patient is not able to be seen by [facility] surgery within the next 3 days that he should return here for wound check in 2 days.  They verbalized understanding of this. Final Clinical Impressions(s) / UC Diagnoses   Final diagnoses:  Abscess of buttock, left     Discharge Instructions     Start the antibiotic tomorrow, it is at your pharmacy  I think you should follow up with a Surgeon, call [facility] tomorrow to set up appointment  Regardless this wound should be rechecked in 2 days  Apply gentle pressure to the wound while showering  Keep it bandaged at all times outside of showering      ED Prescriptions    Medication Sig Dispense Auth. Provider   sulfamethoxazole-trimethoprim (BACTRIM DS) 800-160 MG tablet Take 1 tablet by mouth 2 (two) times daily for 10 days. 20 tablet Latish Toutant, Veryl Speak, PA-C     PDMP not reviewed this encounter.   Hermelinda Medicus,  PA-C 08/31/19 2048

## 2019-08-31 NOTE — ED Triage Notes (Signed)
Pt presents with recurrent abscess on left buttocks.

## 2019-08-31 NOTE — Discharge Instructions (Addendum)
Start the antibiotic tomorrow, it is at your pharmacy  I think you should follow up with a Surgeon, call [facility] tomorrow to set up appointment  Regardless this wound should be rechecked in 2 days  Apply gentle pressure to the wound while showering  Keep it bandaged at all times outside of showering

## 2019-09-01 MED FILL — ?SULFAMETHOXAZOLE-TMP DS TB: 800-160 | 10 days supply | Qty: 20 | Fill #0

## 2019-09-02 MED FILL — ?OMEPRAZOLE 20 MG CPDR: 20 | 30 days supply | Qty: 30 | Fill #2

## 2019-09-04 LAB — AEROBIC CULTURE W GRAM STAIN (SUPERFICIAL SPECIMEN)

## 2019-09-07 ENCOUNTER — Telehealth (HOSPITAL_COMMUNITY): Payer: Self-pay | Admitting: Physician Assistant

## 2019-09-07 NOTE — Telephone Encounter (Cosign Needed)
Attempted phone contact with patient to check on follow up from last visit. No answer, no voice messaging system set up. Chart review shows Nurse visit with [facility] on 7/8, staff message sent to patients PCP regarding Urgent Care visit on 6/29 and need for patient to follow up for wound check and packing removal.

## 2019-09-09 ENCOUNTER — Ambulatory Visit: Payer: 59 | Attending: Nurse Practitioner

## 2019-09-09 ENCOUNTER — Ambulatory Visit: Payer: 59

## 2019-09-09 ENCOUNTER — Other Ambulatory Visit: Payer: Self-pay

## 2019-09-09 DIAGNOSIS — D649 Anemia, unspecified: Secondary | ICD-10-CM | POA: Diagnosis not present

## 2019-09-09 MED ORDER — CYANOCOBALAMIN 1000 MCG/ML IJ SOLN: 1000.0000 ug | Freq: Once | INTRAMUSCULAR | Status: AC

## 2019-09-09 NOTE — Progress Notes (Signed)
Pt was placed on the nurse schedule for a B12 shot for normocytic anemia / skipped a month as per NP instructions.    Injection well tolerated, no reaction 15 min post administration noted.  Last B12 completed lab was done on   on 08/03/2019 / Will verify with NP if next month shot is needed.

## 2019-09-17 MED FILL — LOSARTAN POTASSIUM 100 MG T: 100 | 30 days supply | Qty: 30 | Fill #2

## 2019-09-20 MED FILL — LOSARTAN POTASSIUM 100 MG T: 100 | 30 days supply | Qty: 30 | Fill #2

## 2019-10-11 ENCOUNTER — Ambulatory Visit: Payer: 59

## 2019-11-05 ENCOUNTER — Other Ambulatory Visit: Payer: Self-pay

## 2019-11-05 ENCOUNTER — Encounter: Payer: Self-pay | Admitting: Nurse Practitioner

## 2019-11-05 ENCOUNTER — Other Ambulatory Visit: Payer: Self-pay | Admitting: Nurse Practitioner

## 2019-11-05 ENCOUNTER — Ambulatory Visit: Payer: 59 | Attending: Nurse Practitioner | Admitting: Nurse Practitioner

## 2019-11-05 VITALS — BP 126/77 | HR 83 | Temp 97.7°F | Ht 68.0 in | Wt 108.2 lb

## 2019-11-05 DIAGNOSIS — F17200 Nicotine dependence, unspecified, uncomplicated: Secondary | ICD-10-CM

## 2019-11-05 DIAGNOSIS — K219 Gastro-esophageal reflux disease without esophagitis: Secondary | ICD-10-CM

## 2019-11-05 DIAGNOSIS — D649 Anemia, unspecified: Secondary | ICD-10-CM

## 2019-11-05 DIAGNOSIS — R634 Abnormal weight loss: Secondary | ICD-10-CM

## 2019-11-05 DIAGNOSIS — Z122 Encounter for screening for malignant neoplasm of respiratory organs: Secondary | ICD-10-CM

## 2019-11-05 DIAGNOSIS — I10 Essential (primary) hypertension: Secondary | ICD-10-CM | POA: Diagnosis not present

## 2019-11-05 MED ORDER — AMLODIPINE BESYLATE 10 MG PO TABS: 10.0000 mg | ORAL_TABLET | Freq: Every day | ORAL | 1 refills | Status: DC

## 2019-11-05 MED ORDER — LOSARTAN POTASSIUM 100 MG PO TABS: 100.0000 mg | ORAL_TABLET | Freq: Every day | ORAL | 1 refills | Status: AC

## 2019-11-05 MED ORDER — IRON (FERROUS SULFATE) 325 (65 FE) MG PO TABS: 1.0000 | ORAL_TABLET | Freq: Two times a day (BID) | ORAL | 0 refills | Status: DC

## 2019-11-05 MED ORDER — OMEPRAZOLE 20 MG PO CPDR: 20.0000 mg | DELAYED_RELEASE_CAPSULE | Freq: Every day | ORAL | 1 refills | Status: AC

## 2019-11-05 MED FILL — OMEPRAZOLE 20 MG CAP: 20 | 30 days supply | Qty: 30 | Fill #0

## 2019-11-05 MED FILL — AMLODIPINE BESYLATE 10 MG T: 10 | 30 days supply | Qty: 30 | Fill #0

## 2019-11-05 MED FILL — FERROUS SULFATE 325 MG TAB: 325 (65 FE) | 30 days supply | Qty: 60 | Fill #0

## 2019-11-05 MED FILL — LOSARTAN POTASSIUM 100 MG T: 100 | 30 days supply | Qty: 30 | Fill #0

## 2019-11-05 NOTE — Addendum Note (Signed)
Addended byVidal Schwalbe on: 11/05/2019 02:18 PM   Modules accepted: Orders

## 2019-11-05 NOTE — Progress Notes (Signed)
Assessment & Plan:  Erik Clay was seen today for follow-up.  Diagnoses and all orders for this visit:  Essential hypertension -     Basic metabolic panel -     losartan (COZAAR) 100 MG tablet; Take 1 tablet (100 mg total) by mouth daily. -     amLODipine (NORVASC) 10 MG tablet; Take 1 tablet (10 mg total) by mouth daily. Continue all antihypertensives as prescribed.  Remember to bring in your blood pressure log with you for your follow up appointment.  DASH/Mediterranean Diets are healthier choices for HTN.    Anemia, unspecified type -     CBC -     B12 and Folate Panel -     Iron, TIBC and Ferritin Panel -     Ambulatory referral to Gastroenterology -     Iron, Ferrous Sulfate, 325 (65 Fe) MG TABS; Take 1 tablet by mouth in the morning and at bedtime. -     CA 125  Gastroesophageal reflux disease -     omeprazole (PRILOSEC) 20 MG capsule; Take 1 capsule (20 mg total) by mouth daily. INSTRUCTIONS: Avoid GERD Triggers: acidic, spicy or fried foods, caffeine, coffee, sodas,  alcohol and chocolate.   Weight loss, abnormal -     CA 125  Tobacco dependence -     CA 125 Erik Clay was counseled on the dangers of tobacco use, and was advised to quit. Reviewed strategies to maximize success, including removing cigarettes and smoking materials from environment, stress management and support of family/friends as well as pharmacological alternatives including: Wellbutrin, Chantix, Nicotine patch, Nicotine gum or lozenges. Smoking cessation support: smoking cessation hotline: 1-800-QUIT-NOW.  Smoking cessation classes are also available through [facility]. Call [phone] or visit our website at [email or web address].   A total of 3 minutes was spent on counseling for smoking cessation and Erik Clay is not ready to quit.     Patient has been counseled on age-appropriate routine health concerns for screening and prevention. These are reviewed and up-to-date.  Referrals have been placed accordingly. Immunizations are up-to-date or declined.    Subjective:   Chief Complaint  Patient presents with   Follow-up    Pt. is here for blood pressure and B12 follow up.    HPI Erik Clay 59 y.o. male presents to office today for HTN.  He has a history of anemia and B12 deficiency. GI had recommended EGD and colonoscopy however he was uninsured at the time and could not afford. He now has insurance through [employer]. Will refer back to GI.   He has been a smoker for >40 years and currently smokes 1/2 ppd. Has smoked up to 1ppd in the past. He has lost 9lbs since March unintentionally. TSH is normal. He is also anemic with B12 deficiency.   Essential Hypertension Blood pressure is well controlled noted being 10 mg daily and losartan 100 mg daily. Denies chest pain, shortness of breath, palpitations, lightheadedness, dizziness, headaches or BLE edema.  BP Readings from Last 3 Encounters:  11/05/19 126/77  08/31/19 138/87  08/03/19 135/84    Review of Systems  Constitutional: Positive for weight loss. Negative for fever and malaise/fatigue.  HENT: Negative.  Negative for nosebleeds.   Eyes: Negative.  Negative for blurred vision, double vision and photophobia.  Respiratory: Negative.  Negative for cough and shortness of breath.   Cardiovascular: Negative.  Negative for chest pain, palpitations and leg swelling.  Gastrointestinal: Positive for heartburn. Negative for nausea and  vomiting.  Musculoskeletal: Negative.  Negative for myalgias.  Neurological: Negative.  Negative for dizziness, focal weakness, seizures and headaches.  Psychiatric/Behavioral: Negative.  Negative for suicidal ideas.    Past Medical History:  Diagnosis Date   GERD (gastroesophageal reflux disease)    Perirectal abscess     Past Surgical History:  Procedure Laterality Date   INCISION AND DRAINAGE ABSCESS N/A 09/23/2018   Procedure: INCISION AND DRAINAGE  PERIRECTAL ABSCESS;  Surgeon: Andria Meuse, MD;  Location: [facility] OR;  Service: General;  Laterality: N/A;   INCISION AND DRAINAGE PERIRECTAL ABSCESS N/A 01/28/2018   Procedure: IRRIGATION AND DEBRIDEMENT PERIRECTAL ABSCESS;  Surgeon: Sheliah Hatch De Blanch, MD;  Location: [facility] OR;  Service: General;  Laterality: N/A;   NO PAST SURGERIES      Family History  Problem Relation Age of Onset   Diabetes Mother    Hypertension Mother    Hypertension Father    Diabetes Sister    Hypertension Sister    Hypertension Brother    Hypertension Sister     Social History Reviewed with no changes to be made today.   Outpatient Medications Prior to Visit  Medication Sig Dispense Refill   acetaminophen (TYLENOL) 500 MG tablet Take 2 tablets (1,000 mg total) by mouth every 6 (six) hours. 30 tablet 0   ibuprofen (ADVIL) 200 MG tablet Take 200 mg by mouth every 6 (six) hours as needed.     Iron, Ferrous Sulfate, 325 (65 Fe) MG TABS Take 1 tablet by mouth in the morning and at bedtime. 60 tablet 0   omeprazole (PRILOSEC) 20 MG capsule TAKE 1 CAPSULE BY MOUTH DAILY. 30 capsule 2   nicotine (NICODERM CQ - DOSED IN MG/24 HOURS) 14 mg/24hr patch Place 1 patch (14 mg total) onto the skin daily. (Patient not taking: Reported on 05/18/2019) 36 patch 0   amLODipine (NORVASC) 10 MG tablet Take 1 tablet (10 mg total) by mouth daily. 90 tablet 1   losartan (COZAAR) 100 MG tablet Take 1 tablet (100 mg total) by mouth daily. 90 tablet 1   Facility-Administered Medications Prior to Visit  Medication Dose Route Frequency Provider Last Rate Last Admin   cyanocobalamin ((VITAMIN B-12)) injection 1,000 mcg  1,000 mcg Intramuscular Once Claiborne Rigg, NP        No Known Allergies     Objective:    BP 126/77 (BP Location: Left Arm, Patient Position: Sitting, Cuff Size: Small)    Pulse 83    Temp 97.7 F (36.5 C) (Temporal)    Ht 5\' 8"  (1.727 m)    Wt 108 lb 3.2 oz (49.1 kg)    SpO2 100%    BMI  16.45 kg/m  Wt Readings from Last 3 Encounters:  11/05/19 108 lb 3.2 oz (49.1 kg)  08/03/19 112 lb (50.8 kg)  05/18/19 117 lb 6.4 oz (53.3 kg)    Physical Exam Vitals and nursing note reviewed.  Constitutional:      Appearance: He is underweight.  HENT:     Head: Normocephalic and atraumatic.  Cardiovascular:     Rate and Rhythm: Normal rate and regular rhythm.     Heart sounds: Normal heart sounds. No murmur heard.  No friction rub. No gallop.   Pulmonary:     Effort: Pulmonary effort is normal. No tachypnea or respiratory distress.     Breath sounds: Normal breath sounds. No decreased breath sounds, wheezing, rhonchi or rales.  Chest:     Chest wall: No tenderness.  Abdominal:     General: Bowel sounds are normal.     Palpations: Abdomen is soft.  Musculoskeletal:        General: Normal range of motion.     Cervical back: Normal range of motion.  Skin:    General: Skin is warm and dry.  Neurological:     Mental Status: He is alert and oriented to person, place, and time.     Coordination: Coordination normal.  Psychiatric:        Behavior: Behavior normal. Behavior is cooperative.        Thought Content: Thought content normal.        Judgment: Judgment normal.          Patient has been counseled extensively about nutrition and exercise as well as the importance of adherence with medications and regular follow-up. The patient was given clear instructions to go to ER or return to medical center if symptoms don't improve, worsen or new problems develop. The patient verbalized understanding.   Follow-up: Return in about 3 months (around 02/04/2020).   Claiborne Rigg, FNP-BC [facility] [place], [place] [phone]   11/05/2019, 2:06 PM

## 2019-11-06 LAB — CBC
Hematocrit: 32.6 % — ABNORMAL LOW (ref 37.5–51.0)
Hemoglobin: 9.5 g/dL — ABNORMAL LOW (ref 13.0–17.7)
MCH: 23.3 pg — ABNORMAL LOW (ref 26.6–33.0)
MCHC: 29.1 g/dL — ABNORMAL LOW (ref 31.5–35.7)
MCV: 80 fL (ref 79–97)
Platelets: 386 10*3/uL (ref 150–450)
RBC: 4.07 x10E6/uL — ABNORMAL LOW (ref 4.14–5.80)
RDW: 19.2 % — ABNORMAL HIGH (ref 11.6–15.4)
WBC: 12.9 10*3/uL — ABNORMAL HIGH (ref 3.4–10.8)

## 2019-11-06 LAB — BASIC METABOLIC PANEL
BUN/Creatinine Ratio: 11 (ref 9–20)
BUN: 14 mg/dL (ref 6–24)
CO2: 22 mmol/L (ref 20–29)
Calcium: 8.9 mg/dL (ref 8.7–10.2)
Chloride: 102 mmol/L (ref 96–106)
Creatinine, Ser: 1.26 mg/dL (ref 0.76–1.27)
GFR calc Af Amer: 73 mL/min/{1.73_m2} (ref >59)
GFR calc non Af Amer: 63 mL/min/{1.73_m2} (ref >59)
Glucose: 87 mg/dL (ref 65–99)
Potassium: 4.7 mmol/L (ref 3.5–5.2)
Sodium: 138 mmol/L (ref 134–144)

## 2019-11-06 LAB — IRON,TIBC AND FERRITIN PANEL
Ferritin: 20 ng/mL — ABNORMAL LOW (ref 30–400)
Iron Saturation: 3 % — CL (ref 15–55)
Iron: 11 ug/dL — ABNORMAL LOW (ref 38–169)
Total Iron Binding Capacity: 404 ug/dL (ref 250–450)
UIBC: 393 ug/dL — ABNORMAL HIGH (ref 111–343)

## 2019-11-06 LAB — B12 AND FOLATE PANEL
Folate: 5.1 ng/mL (ref >3.0)
Vitamin B-12: 404 pg/mL (ref 232–1245)

## 2019-11-06 LAB — CA 125: Cancer Antigen (CA) 125: 5.3 U/mL

## 2019-11-08 ENCOUNTER — Other Ambulatory Visit: Payer: Self-pay | Admitting: Nurse Practitioner

## 2019-11-08 DIAGNOSIS — D649 Anemia, unspecified: Secondary | ICD-10-CM

## 2019-11-08 DIAGNOSIS — E538 Deficiency of other specified B group vitamins: Secondary | ICD-10-CM

## 2019-11-14 ENCOUNTER — Ambulatory Visit (INDEPENDENT_AMBULATORY_CARE_PROVIDER_SITE_OTHER): Payer: 59

## 2019-11-14 ENCOUNTER — Encounter (HOSPITAL_COMMUNITY): Payer: Self-pay

## 2019-11-14 ENCOUNTER — Ambulatory Visit (HOSPITAL_COMMUNITY)
Admission: EM | Admit: 2019-11-14 | Discharge: 2019-11-14 | Disposition: A | Discharge status: Home / Self Care | Payer: 59 | Attending: Urgent Care | Admitting: Urgent Care

## 2019-11-14 DIAGNOSIS — R0902 Hypoxemia: Secondary | ICD-10-CM | POA: Diagnosis not present

## 2019-11-14 DIAGNOSIS — K219 Gastro-esophageal reflux disease without esophagitis: Secondary | ICD-10-CM | POA: Insufficient documentation

## 2019-11-14 DIAGNOSIS — R05 Cough: Secondary | ICD-10-CM | POA: Insufficient documentation

## 2019-11-14 DIAGNOSIS — F17210 Nicotine dependence, cigarettes, uncomplicated: Secondary | ICD-10-CM | POA: Insufficient documentation

## 2019-11-14 DIAGNOSIS — Z79899 Other long term (current) drug therapy: Secondary | ICD-10-CM | POA: Insufficient documentation

## 2019-11-14 DIAGNOSIS — Z20822 Contact with and (suspected) exposure to covid-19: Secondary | ICD-10-CM | POA: Insufficient documentation

## 2019-11-14 DIAGNOSIS — R9389 Abnormal findings on diagnostic imaging of other specified body structures: Secondary | ICD-10-CM | POA: Diagnosis not present

## 2019-11-14 DIAGNOSIS — F17200 Nicotine dependence, unspecified, uncomplicated: Secondary | ICD-10-CM

## 2019-11-14 DIAGNOSIS — R062 Wheezing: Secondary | ICD-10-CM

## 2019-11-14 DIAGNOSIS — R059 Cough, unspecified: Secondary | ICD-10-CM

## 2019-11-14 LAB — SARS CORONAVIRUS 2 (TAT 6-24 HRS): SARS Coronavirus 2: NEGATIVE

## 2019-11-14 MED ORDER — METHYLPREDNISOLONE ACETATE 80 MG/ML IJ SUSP
80.0000 mg | Freq: Once | INTRAMUSCULAR | Status: AC
  Administered 2019-11-14: 80 mg via INTRAMUSCULAR

## 2019-11-14 MED ORDER — ALBUTEROL SULFATE HFA 108 (90 BASE) MCG/ACT IN AERS
2.0000 | INHALATION_SPRAY | Freq: Once | RESPIRATORY_TRACT | Status: AC
  Administered 2019-11-14: 2 via RESPIRATORY_TRACT

## 2019-11-14 MED ORDER — METHYLPREDNISOLONE ACETATE 80 MG/ML IJ SUSP
INTRAMUSCULAR | Status: AC
  Filled 2019-11-14: qty 1

## 2019-11-14 MED ORDER — ALBUTEROL SULFATE HFA 108 (90 BASE) MCG/ACT IN AERS
INHALATION_SPRAY | RESPIRATORY_TRACT | Status: AC
  Filled 2019-11-14: qty 6.7

## 2019-11-14 NOTE — ED Triage Notes (Signed)
Pt c/o productive cough w/white mucousx4 days.

## 2019-11-14 NOTE — Discharge Instructions (Signed)
Mr. Erik Clay, your chest x-ray shows that you have developed emphysema which is related to your long history of smoking.  We have given you a steroid injection and an albuterol inhaler.  Your COVID-19 test is pending.  This is also possible given that you have not had vaccination.  Right now I am not redirecting you to the emergency room/hospital.  However, if you start to have chest pain, shortness of breath and persistent fevers please report to the emergency room immediately.  If your symptoms are severe, call an ambulance.  Otherwise you can have a friend or family member drive you to the hospital.

## 2019-11-14 NOTE — ED Provider Notes (Signed)
[facility]   MRN: [id number] DOB: 11/27/1960  Subjective:   Erik Clay is a 59 y.o. male presenting for acute onset 4-day history of persistent and productive cough.  Denies fever, sore throat, chest pain, shortness of breath.  Denies history of lung disorder, asthma, COPD.  Patient states that he has longstanding history of smoking, greater than 40 pack years.  He does have a PCP, nurse practitioner [name].  Has not been vaccinated against COVID-19.   Current Facility-Administered Medications:  .  cyanocobalamin ((VITAMIN B-12)) injection 1,000 mcg, 1,000 mcg, Intramuscular, Once, Claiborne Rigg, NP  Current Outpatient Medications:  .  acetaminophen (TYLENOL) 500 MG tablet, Take 2 tablets (1,000 mg total) by mouth every 6 (six) hours., Disp: 30 tablet, Rfl: 0 .  amLODipine (NORVASC) 10 MG tablet, Take 1 tablet (10 mg total) by mouth daily., Disp: 90 tablet, Rfl: 1 .  ibuprofen (ADVIL) 200 MG tablet, Take 200 mg by mouth every 6 (six) hours as needed., Disp: , Rfl:  .  Iron, Ferrous Sulfate, 325 (65 Fe) MG TABS, Take 1 tablet by mouth in the morning and at bedtime., Disp: 60 tablet, Rfl: 0 .  losartan (COZAAR) 100 MG tablet, Take 1 tablet (100 mg total) by mouth daily., Disp: 90 tablet, Rfl: 1 .  nicotine (NICODERM CQ - DOSED IN MG/24 HOURS) 14 mg/24hr patch, Place 1 patch (14 mg total) onto the skin daily. (Patient not taking: Reported on 05/18/2019), Disp: 36 patch, Rfl: 0 .  omeprazole (PRILOSEC) 20 MG capsule, Take 1 capsule (20 mg total) by mouth daily., Disp: 90 capsule, Rfl: 1   No Known Allergies  Past Medical History:  Diagnosis Date  . GERD (gastroesophageal reflux disease)   . Perirectal abscess      Past Surgical History:  Procedure Laterality Date  . INCISION AND DRAINAGE ABSCESS N/A 09/23/2018   Procedure: INCISION AND DRAINAGE PERIRECTAL ABSCESS;  Surgeon: Andria Meuse, MD;  Location: [facility] OR;  Service: General;  Laterality: N/A;  .  INCISION AND DRAINAGE PERIRECTAL ABSCESS N/A 01/28/2018   Procedure: IRRIGATION AND DEBRIDEMENT PERIRECTAL ABSCESS;  Surgeon: Sheliah Hatch De Blanch, MD;  Location: [facility] OR;  Service: General;  Laterality: N/A;  . NO PAST SURGERIES      Family History  Problem Relation Age of Onset  . Diabetes Mother   . Hypertension Mother   . Hypertension Father   . Diabetes Sister   . Hypertension Sister   . Hypertension Brother   . Hypertension Sister     Social History   Tobacco Use  . Smoking status: Current Every Day Smoker    Packs/day: 0.50    Types: Cigarettes  . Smokeless tobacco: Never Used  Vaping Use  . Vaping Use: Never used  Substance Use Topics  . Alcohol use: Not Currently    Alcohol/week: 1.0 standard drink    Types: 1 Cans of beer per week    Comment: 1-2 per day  . Drug use: No    ROS   Objective:   Vitals: BP (!) 149/89   Pulse 77   Temp (!) 97.4 F (36.3 C) (Oral)   Resp 16   Ht 5\' 9"  (1.753 m)   Wt 115 lb (52.2 kg)   SpO2 90%   BMI 16.98 kg/m   Physical Exam Constitutional:      General: He is not in acute distress.    Appearance: Normal appearance. He is well-developed. He is ill-appearing. He is not  toxic-appearing or diaphoretic.     Comments: Cachectic  HENT:     Head: Normocephalic and atraumatic.     Right Ear: External ear normal.     Left Ear: External ear normal.     Nose: Nose normal.     Mouth/Throat:     Mouth: Mucous membranes are moist.     Pharynx: Oropharynx is clear.     Comments: No cyanotic lips, pursed lips. Eyes:     General: No scleral icterus.    Extraocular Movements: Extraocular movements intact.     Pupils: Pupils are equal, round, and reactive to light.  Cardiovascular:     Rate and Rhythm: Normal rate and regular rhythm.     Heart sounds: Normal heart sounds. No murmur heard.  No friction rub. No gallop.   Pulmonary:     Effort: Pulmonary effort is normal. No respiratory distress.     Breath sounds: Normal  breath sounds. No stridor. No wheezing, rhonchi or rales.     Comments: Barrel chest.  Decreased lung sounds.  No use of accessory muscles.  Patient has no sign of respiratory distress. Neurological:     Mental Status: He is alert and oriented to person, place, and time.  Psychiatric:        Mood and Affect: Mood normal.        Behavior: Behavior normal.        Thought Content: Thought content normal.     DG Chest 2 View  Result Date: 11/14/2019 CLINICAL DATA:  Productive cough, wheezing EXAM: CHEST - 2 VIEW COMPARISON:  None. FINDINGS: The lungs are clear and negative for focal airspace consolidation, pulmonary edema or suspicious pulmonary nodule. Relative hyperlucency in the upper lungs suggests underlying emphysema. No pleural effusion or pneumothorax. Cardiac and mediastinal contours are within normal limits. No acute fracture or lytic or blastic osseous lesions. The visualized upper abdominal bowel gas pattern is unremarkable. IMPRESSION: No active cardiopulmonary disease. Suspect emphysematous changes in the upper lungs. Electronically Signed   By: Malachy Moan M.D.   On: 11/14/2019 14:37     Assessment and Plan :   PDMP not reviewed this encounter.  1. Cough   2. Smoker   3. Hypoxemia   4. Abnormal chest x-ray     Patient likely has developed emphysema.  He is hypoxic here in clinic.  Has never had to use any inhalers.  Has longstanding history of smoking.  Will prescribe him albuterol here in the clinic, IM Depo-Medrol.  Strict ER precautions.  COVID-19 testing pending. Counseled patient on potential for adverse effects with medications prescribed/recommended today, ER and return-to-clinic precautions discussed, patient verbalized understanding.    Wallis Bamberg, PA-C 11/14/19 1444

## 2019-11-14 NOTE — ED Notes (Signed)
Mani pa made aware of pulse ox, pt placed on 2 L [place]

## 2019-11-16 ENCOUNTER — Telehealth: Payer: Self-pay | Admitting: Nurse Practitioner

## 2019-11-16 NOTE — Telephone Encounter (Signed)
Patient called and was read lab note by Bertram Denver written 11/08/19. He verbalized understanding of all information. He will look for letter that was sent out.

## 2019-11-18 MED FILL — OMEPRAZOLE 20 MG CAP: 20 | 30 days supply | Qty: 30 | Fill #0

## 2019-11-18 MED FILL — LOSARTAN POTASSIUM 100 MG T: 100 | 30 days supply | Qty: 30 | Fill #0

## 2019-11-19 ENCOUNTER — Telehealth: Payer: Self-pay | Admitting: Nurse Practitioner

## 2019-11-19 NOTE — Telephone Encounter (Signed)
Patient called in and requested for an albuterol inhaler. Patient requested for script to be sent to [facility] pharmacy.

## 2019-11-19 NOTE — Telephone Encounter (Signed)
Copied from CRM [id number]. Topic: General - Call Back - No Documentation >> Nov 19, 2019 12:53 PM Randol Kern wrote: Best contact: [phone] ex [phone]  Kylie, preservice center Prior auth needed

## 2019-11-19 NOTE — Telephone Encounter (Signed)
Will route to PCP 

## 2019-11-22 ENCOUNTER — Other Ambulatory Visit: Payer: Self-pay | Admitting: Nurse Practitioner

## 2019-11-22 MED ORDER — ALBUTEROL SULFATE HFA 108 (90 BASE) MCG/ACT IN AERS: 2.0000 | INHALATION_SPRAY | Freq: Four times a day (QID) | RESPIRATORY_TRACT | 1 refills | Status: DC | PRN

## 2019-11-22 NOTE — Telephone Encounter (Signed)
Sent!

## 2019-11-23 ENCOUNTER — Ambulatory Visit (HOSPITAL_COMMUNITY): Payer: 59

## 2019-11-23 MED FILL — ALBUTEROL SULFATE HFA 108 (: 108 (90 BAS | 25 days supply | Qty: 18 | Fill #0

## 2019-11-24 IMAGING — CT CT PELVIS W/ CM
2 of 3 series · 17 of 46 positions shown, 19 images · IV contrast (APPLIED)
Comparison: None.

CLINICAL DATA: Left buttocks abscess.

EXAM:
CT PELVIS WITH CONTRAST
TECHNIQUE: Multidetector CT imaging of the pelvis was performed using the
standard protocol following the bolus administration of intravenous
contrast.
CONTRAST:  100mL OMNIPAQUE IOHEXOL 300 MG/ML  SOLN

[Series 3: abd/ pelvis 5.0 i30f 2 · axial · 0.61mm/px · z∈[+506,+776]mm · 14 of 63 slices shown, 16 images]
[im 5/63  soft-tissue]
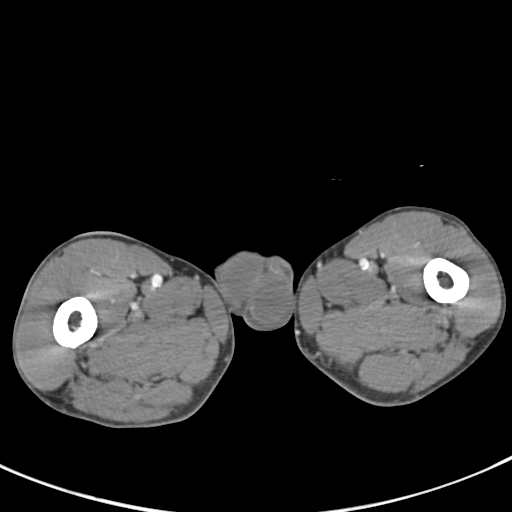
[im 5/63  bone]
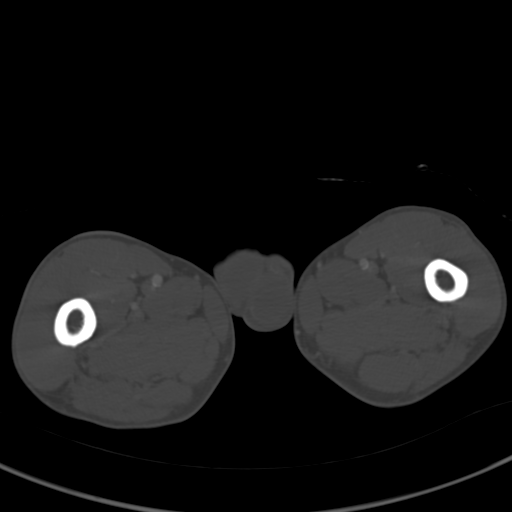
[im 9/63  soft-tissue]
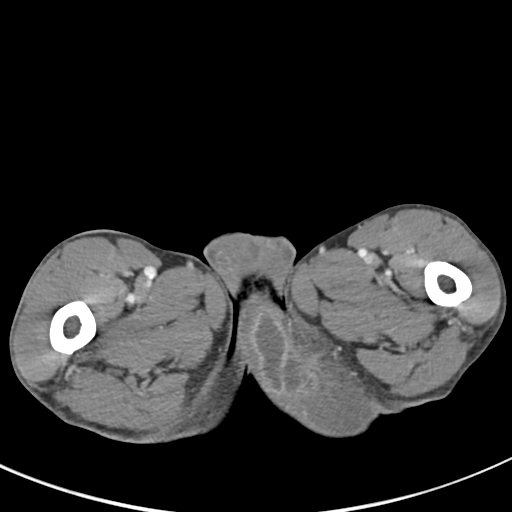
[im 13/63  soft-tissue]
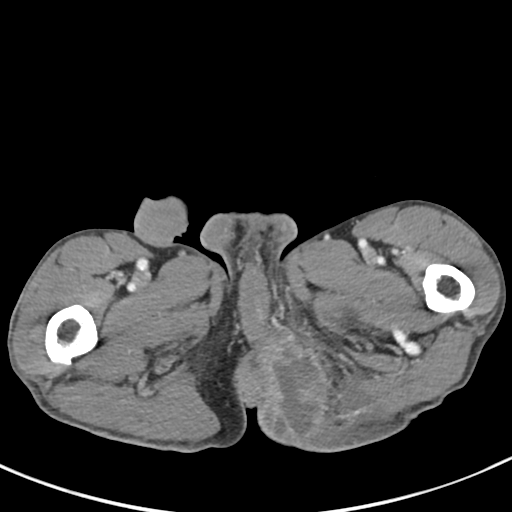
[im 17/63  soft-tissue]
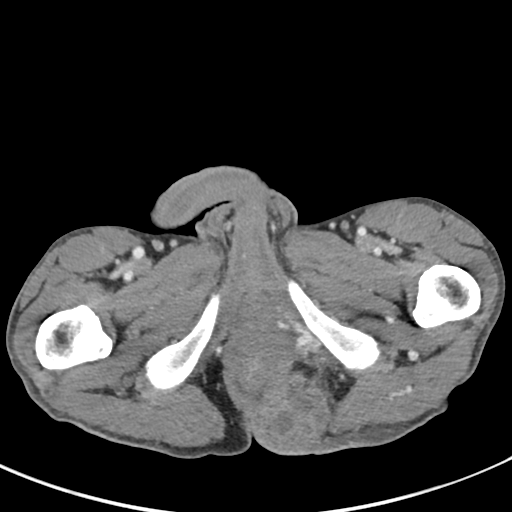
[im 21/63  soft-tissue]
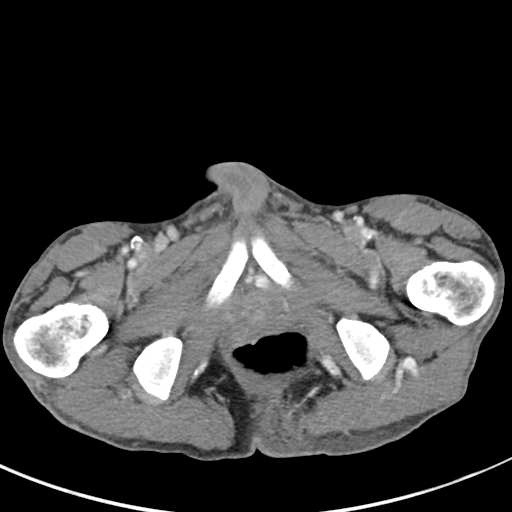
[im 25/63  soft-tissue]
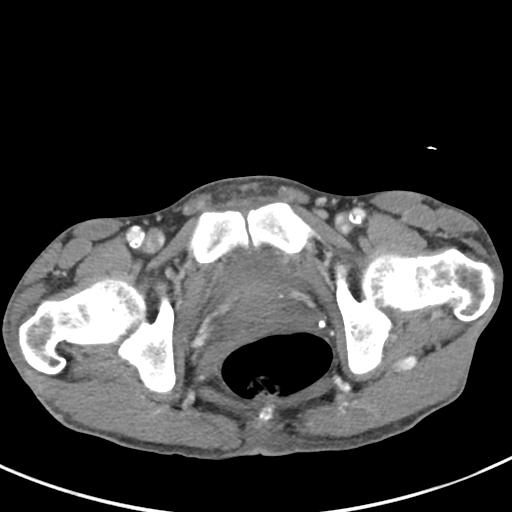
[im 29/63  soft-tissue]
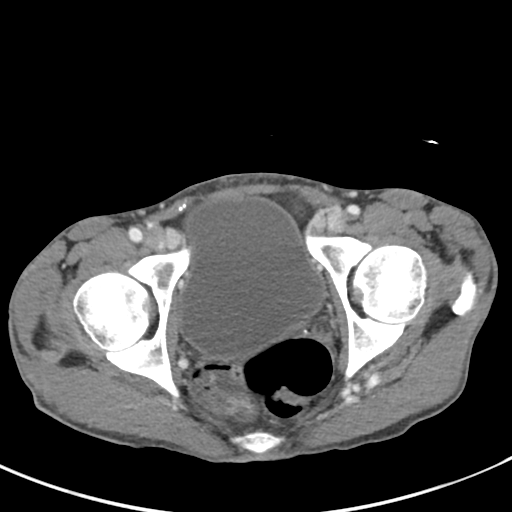
[im 35/63  soft-tissue]
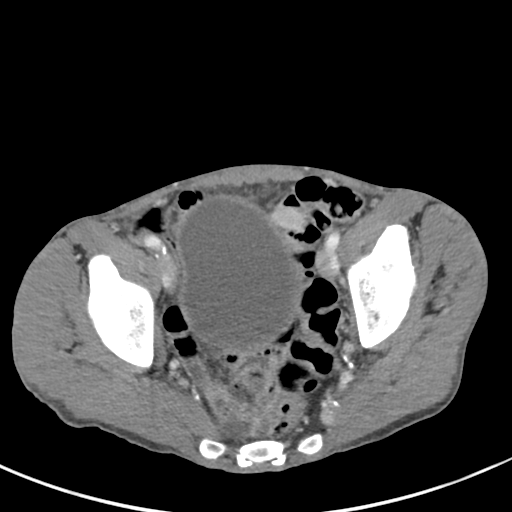
[im 39/63  soft-tissue]
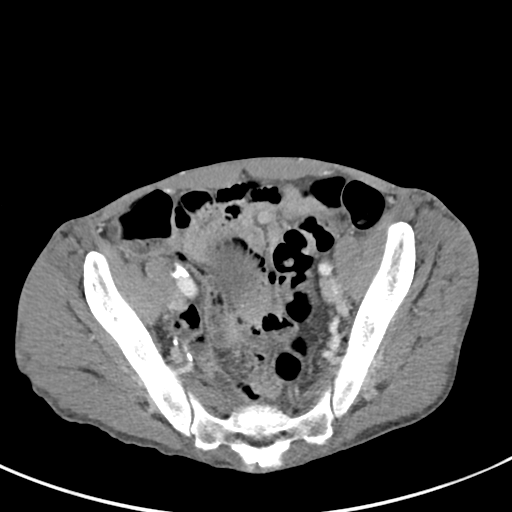
[im 39/63  bone]
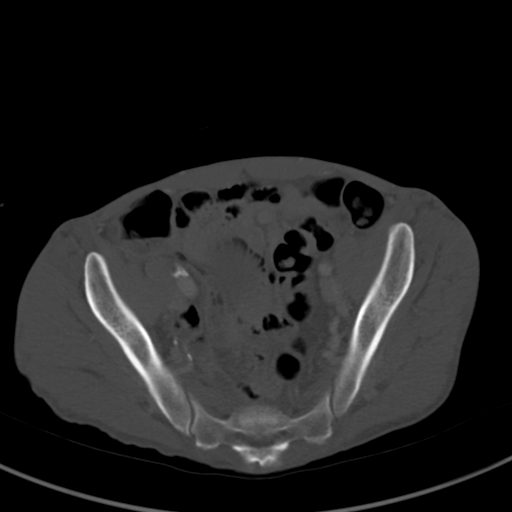
[im 43/63  soft-tissue]
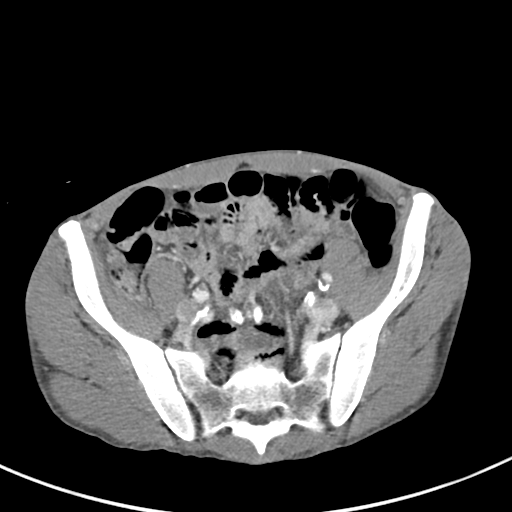
[im 47/63  soft-tissue]
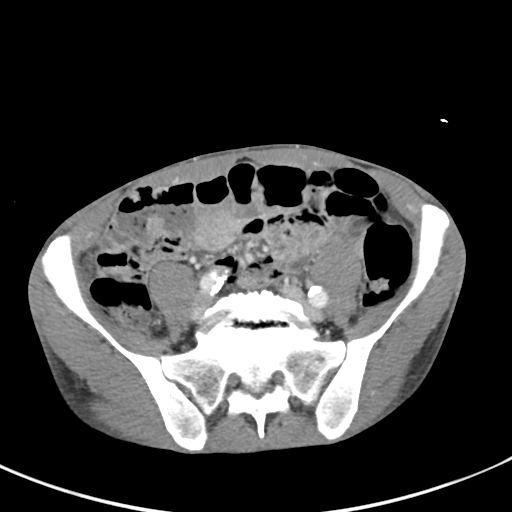
[im 51/63  soft-tissue]
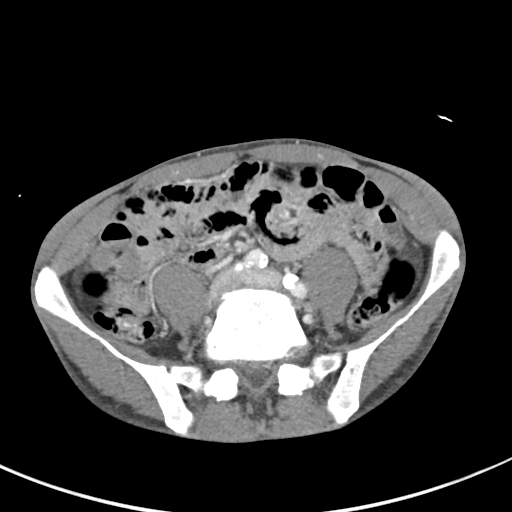
[im 55/63  soft-tissue]
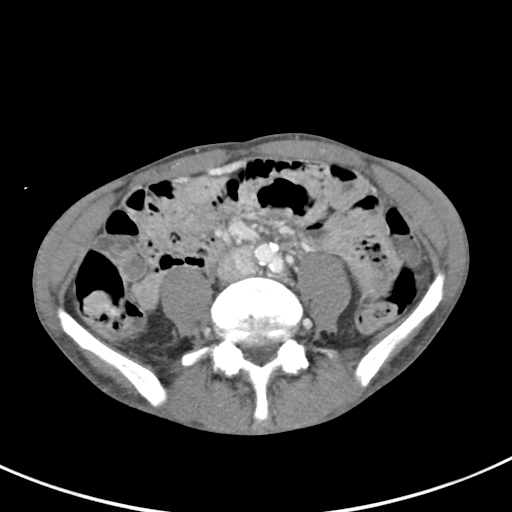
[im 59/63  soft-tissue]
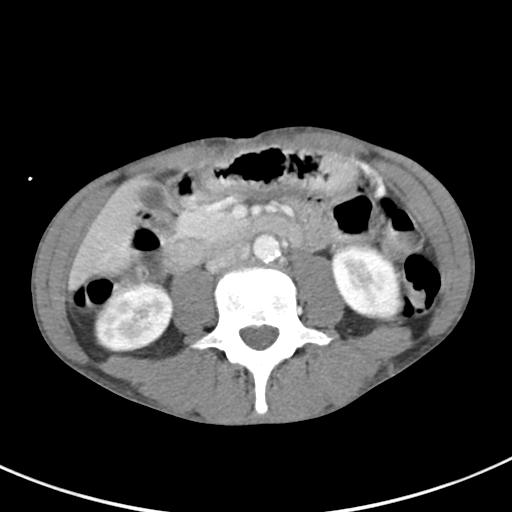

[Series 5: coronal soft tissue · coronal · 0.61mm/px · 3 of 79 slices shown]
[im 27/79  soft-tissue]
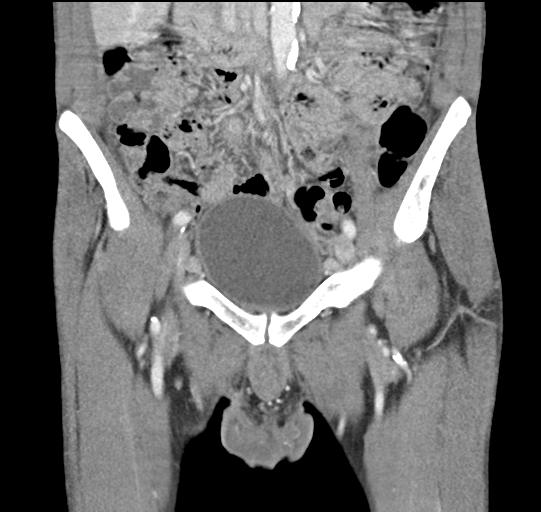
[im 35/79  soft-tissue]
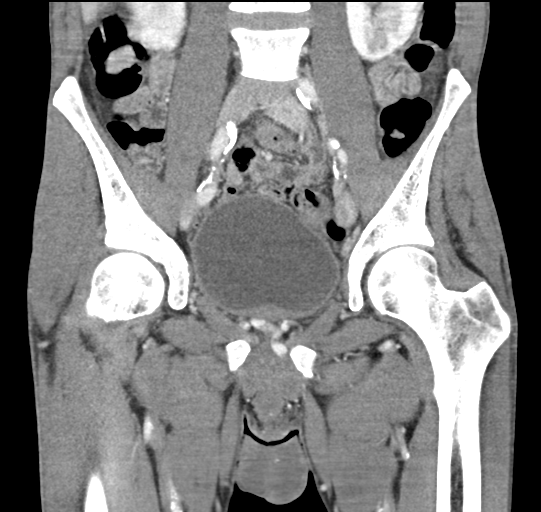
[im 44/79  soft-tissue]
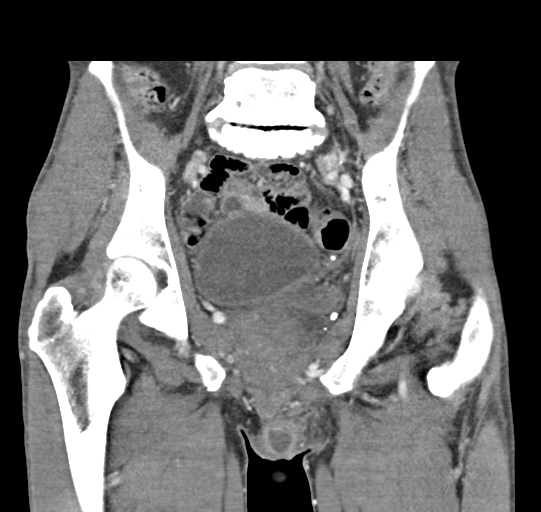

[17 of 46 positions shown; findings below may reference images not displayed]

FINDINGS: Urinary Tract:  No abnormality visualized.

Bowel:  Unremarkable visualized pelvic bowel loops.

Vascular/Lymphatic: Atherosclerosis of abdominal aorta and iliac
arteries is noted without aneurysm formation. No adenopathy is
noted.

Reproductive:  Prostate gland is unremarkable.

Other: 9.0 x 4.8 x 4.5 cm irregular enhancing multiloculated fluid
collection is seen in the left buttocks and perineal region
concerning for abscess.

Musculoskeletal: No significant abnormality is noted.
IMPRESSION: 9.0 x 4.8 x 4.5 cm irregular enhancing multiloculated fluid
collection is seen in the left buttocks region as well as in the
left perineal region, consistent with multiloculated abscess.

Aortic Atherosclerosis ([id number]).

## 2019-12-10 MED FILL — AMLODIPINE BESYLATE 10 MG T: 10 | 30 days supply | Qty: 30 | Fill #0

## 2019-12-13 MED FILL — ALBUTEROL SULFATE HFA 108 (: 108 (90 BAS | 25 days supply | Qty: 18 | Fill #1

## 2019-12-16 ENCOUNTER — Inpatient Hospital Stay: Payer: 59 | Admitting: Oncology

## 2019-12-21 MED FILL — ALBUTEROL SULFATE HFA 108 (: 108 (90 BAS | 25 days supply | Qty: 18 | Fill #1

## 2019-12-21 MED FILL — AMLODIPINE BESYLATE 10 MG T: 10 | 30 days supply | Qty: 30 | Fill #0

## 2020-01-04 ENCOUNTER — Inpatient Hospital Stay: Payer: 59 | Attending: Oncology | Admitting: Oncology

## 2020-01-04 ENCOUNTER — Other Ambulatory Visit: Payer: Self-pay

## 2020-01-04 VITALS — BP 149/96 | HR 68 | Temp 97.8°F | Resp 18 | Ht 69.0 in | Wt 107.1 lb

## 2020-01-04 DIAGNOSIS — J449 Chronic obstructive pulmonary disease, unspecified: Secondary | ICD-10-CM | POA: Diagnosis not present

## 2020-01-04 DIAGNOSIS — I10 Essential (primary) hypertension: Secondary | ICD-10-CM | POA: Diagnosis not present

## 2020-01-04 DIAGNOSIS — E538 Deficiency of other specified B group vitamins: Secondary | ICD-10-CM | POA: Insufficient documentation

## 2020-01-04 DIAGNOSIS — D509 Iron deficiency anemia, unspecified: Secondary | ICD-10-CM | POA: Diagnosis present

## 2020-01-04 DIAGNOSIS — D649 Anemia, unspecified: Secondary | ICD-10-CM | POA: Diagnosis not present

## 2020-01-04 DIAGNOSIS — K219 Gastro-esophageal reflux disease without esophagitis: Secondary | ICD-10-CM | POA: Insufficient documentation

## 2020-01-04 DIAGNOSIS — F17210 Nicotine dependence, cigarettes, uncomplicated: Secondary | ICD-10-CM | POA: Diagnosis not present

## 2020-01-04 DIAGNOSIS — Z79899 Other long term (current) drug therapy: Secondary | ICD-10-CM | POA: Diagnosis not present

## 2020-01-04 NOTE — Progress Notes (Signed)
Reason for the request:    Anemia  HPI: I was asked by Bertram Denver, NP to evaluate Mr. Zellars for the diagnosis of anemia. He is a 59 year old man with history of COPD and hypertension as well as a noted to have anemia. In February 2021 his hemoglobin was noted to be 9.7 with iron level of 9 and ferritin of 8.5. Was also noted to have B12 level of 169. Based on these findings he was started on B12 injections. He was evaluated by gastroenterology recommend to have a colonoscopy and endoscopy which has not been scheduled because of coverage issues.  He was started on oral iron replacement although has not been taking it consistently.  He reports 1 week he might take it 2-3 times at best.  Repeat laboratory testing in September 2021 showed iron level of 11 ferritin of 20 and saturation of 3%. His vitamin B12 level has improved to 404. His hemoglobin continues at 9.5 with MCV of 80 and elevated RDW. Clinically,  He does not report any headaches, blurry vision, syncope or seizures. Does not report any fevers, chills or sweats.  Does not report any cough, wheezing or hemoptysis.  Does not report any chest pain, palpitation, orthopnea or leg edema.  Does not report any nausea, vomiting or abdominal pain.  Does not report any constipation or diarrhea.  Does not report any skeletal complaints.    Does not report frequency, urgency or hematuria.  Does not report any skin rashes or lesions. Does not report any heat or cold intolerance.  Does not report any lymphadenopathy or petechiae.  Does not report any anxiety or depression.  Remaining review of systems is negative.    Past Medical History:  Diagnosis Date  . GERD (gastroesophageal reflux disease)   . Perirectal abscess   :  Past Surgical History:  Procedure Laterality Date  . INCISION AND DRAINAGE ABSCESS N/A 09/23/2018   Procedure: INCISION AND DRAINAGE PERIRECTAL ABSCESS;  Surgeon: Andria Meuse, MD;  Location: [facility] OR;  Service: General;   Laterality: N/A;  . INCISION AND DRAINAGE PERIRECTAL ABSCESS N/A 01/28/2018   Procedure: IRRIGATION AND DEBRIDEMENT PERIRECTAL ABSCESS;  Surgeon: Rodman Pickle, MD;  Location: [facility] OR;  Service: General;  Laterality: N/A;  . NO PAST SURGERIES    :   Current Outpatient Medications:  .  acetaminophen (TYLENOL) 500 MG tablet, Take 2 tablets (1,000 mg total) by mouth every 6 (six) hours., Disp: 30 tablet, Rfl: 0 .  albuterol (VENTOLIN HFA) 108 (90 Base) MCG/ACT inhaler, Inhale 2 puffs into the lungs every 6 (six) hours as needed for wheezing or shortness of breath., Disp: 18 g, Rfl: 1 .  amLODipine (NORVASC) 10 MG tablet, Take 1 tablet (10 mg total) by mouth daily., Disp: 90 tablet, Rfl: 1 .  ibuprofen (ADVIL) 200 MG tablet, Take 200 mg by mouth every 6 (six) hours as needed., Disp: , Rfl:  .  Iron, Ferrous Sulfate, 325 (65 Fe) MG TABS, Take 1 tablet by mouth in the morning and at bedtime., Disp: 60 tablet, Rfl: 0 .  losartan (COZAAR) 100 MG tablet, Take 1 tablet (100 mg total) by mouth daily., Disp: 90 tablet, Rfl: 1 .  nicotine (NICODERM CQ - DOSED IN MG/24 HOURS) 14 mg/24hr patch, Place 1 patch (14 mg total) onto the skin daily. (Patient not taking: Reported on 05/18/2019), Disp: 36 patch, Rfl: 0 .  omeprazole (PRILOSEC) 20 MG capsule, Take 1 capsule (20 mg total) by mouth daily., Disp: 90 capsule,  Rfl: 1  Current Facility-Administered Medications:  .  cyanocobalamin ((VITAMIN B-12)) injection 1,000 mcg, 1,000 mcg, Intramuscular, Once, Claiborne Rigg, NP:  No Known Allergies:  Family History  Problem Relation Age of Onset  . Diabetes Mother   . Hypertension Mother   . Hypertension Father   . Diabetes Sister   . Hypertension Sister   . Hypertension Brother   . Hypertension Sister   :  Social History   Socioeconomic History  . Marital status: Single    Spouse name: Not on file  . Number of children: 0  . Years of education: Not on file  . Highest education level: Not on  file  Occupational History  . Not on file  Tobacco Use  . Smoking status: Current Every Day Smoker    Packs/day: 0.50    Types: Cigarettes  . Smokeless tobacco: Never Used  Vaping Use  . Vaping Use: Never used  Substance and Sexual Activity  . Alcohol use: Not Currently    Alcohol/week: 1.0 standard drink    Types: 1 Cans of beer per week    Comment: 1-2 per day  . Drug use: No  . Sexual activity: Not Currently  Other Topics Concern  . Not on file  Social History Narrative  . Not on file   Social Determinants of Health   Financial Resource Strain:   . Difficulty of Paying Living Expenses: Not on file  Food Insecurity:   . Worried About Programme researcher, broadcasting/film/video in the Last Year: Not on file  . Ran Out of Food in the Last Year: Not on file  Transportation Needs:   . Lack of Transportation (Medical): Not on file  . Lack of Transportation (Non-Medical): Not on file  Physical Activity:   . Days of Exercise per Week: Not on file  . Minutes of Exercise per Session: Not on file  Stress:   . Feeling of Stress : Not on file  Social Connections:   . Frequency of Communication with Friends and Family: Not on file  . Frequency of Social Gatherings with Friends and Family: Not on file  . Attends Religious Services: Not on file  . Active Member of Clubs or Organizations: Not on file  . Attends Banker Meetings: Not on file  . Marital Status: Not on file  Intimate Partner Violence:   . Fear of Current or Ex-Partner: Not on file  . Emotionally Abused: Not on file  . Physically Abused: Not on file  . Sexually Abused: Not on file  :  Pertinent items are noted in HPI.  Exam: Blood pressure (!) 149/96, pulse 68, temperature 97.8 F (36.6 C), temperature source Tympanic, resp. rate 18, height 5\' 9"  (1.753 m), weight 107 lb 1.6 oz (48.6 kg), SpO2 97 %.  ECOG 0  General appearance: alert and cooperative appeared without distress. Head: atraumatic without any  abnormalities. Eyes: conjunctivae/corneas clear. PERRL.  Sclera anicteric. Throat: lips, mucosa, and tongue normal; without oral thrush or ulcers. Resp: clear to auscultation bilaterally without rhonchi, wheezes or dullness to percussion. Cardio: regular rate and rhythm, S1, S2 normal, no murmur, click, rub or gallop GI: soft, non-tender; bowel sounds normal; no masses,  no organomegaly Skin: Skin color, texture, turgor normal. No rashes or lesions Lymph nodes: Cervical, supraclavicular, and axillary nodes normal. Neurologic: Grossly normal without any motor, sensory or deep tendon reflexes. Musculoskeletal: No joint deformity or effusion.    Assessment and Plan:    59 year old with:  1. Anemia related to iron deficiency as well as element of B12 deficiency noted in February 2021. His hemoglobin at that time was 9.7. He was started on B12 supplements with normalization of his B12 levels and iron level continues to be low. Etiology of his iron deficiency is unclear. Poor iron absorption versus chronic GI blood losses could be a contributing factor.  Treatment options reviewed at this time which include oral iron replacement versus intravenous iron. Risks and benefits of intravenous iron treatments as well as potential complications were discussed. Different formulations such as Feraheme and Ferrlecit were discussed. Complications that include arthralgias, myalgias, infusion related complications and rarely anaphylaxis.  After discussion today, we opted to proceed with oral iron replacement.  I recommended start taking his iron replacement regularly on a daily basis and will reassess in 3 months.  If he does have less than adequate response we will consider intravenous iron at that time.  2. Colon cancer screening: I recommended proceeding with colonoscopy and endoscopy to rule out active GI source of blood loss at this time.  3. B12 deficiency: I recommended continuing B12 injections which  she currently receiving with his primary care provider.  B12 levels of responded reasonably well and these injections will be repeated in the future if he has declined.  4.  Follow-up: Will be in 3 months to reassess iron levels at that time.   45  minutes were dedicated to this visit. The time was spent on reviewing laboratory data, discussing treatment options, discussing differential diagnosis and answering questions regarding future plan.     A copy of this consult has been forwarded to the requesting provider

## 2020-01-05 ENCOUNTER — Ambulatory Visit: Payer: 59 | Admitting: Nurse Practitioner

## 2020-01-06 ENCOUNTER — Encounter: Payer: 59 | Admitting: Oncology

## 2020-01-20 ENCOUNTER — Ambulatory Visit (INDEPENDENT_AMBULATORY_CARE_PROVIDER_SITE_OTHER): Payer: 59 | Admitting: Nurse Practitioner

## 2020-01-20 ENCOUNTER — Encounter: Payer: Self-pay | Admitting: Nurse Practitioner

## 2020-01-20 ENCOUNTER — Other Ambulatory Visit (INDEPENDENT_AMBULATORY_CARE_PROVIDER_SITE_OTHER): Payer: 59

## 2020-01-20 VITALS — BP 106/70 | HR 76 | Ht 69.0 in | Wt 106.0 lb

## 2020-01-20 DIAGNOSIS — R1310 Dysphagia, unspecified: Secondary | ICD-10-CM

## 2020-01-20 DIAGNOSIS — R634 Abnormal weight loss: Secondary | ICD-10-CM | POA: Diagnosis not present

## 2020-01-20 DIAGNOSIS — D509 Iron deficiency anemia, unspecified: Secondary | ICD-10-CM | POA: Diagnosis not present

## 2020-01-20 LAB — CBC WITH DIFFERENTIAL/PLATELET
Basophils Absolute: 0.1 10*3/uL (ref 0.0–0.1)
Basophils Relative: 1.2 % (ref 0.0–3.0)
Eosinophils Absolute: 0.2 10*3/uL (ref 0.0–0.7)
Eosinophils Relative: 2.9 % (ref 0.0–5.0)
HCT: 33.9 % — ABNORMAL LOW (ref 39.0–52.0)
Hemoglobin: 10.6 g/dL — ABNORMAL LOW (ref 13.0–17.0)
Lymphocytes Relative: 7.3 % — ABNORMAL LOW (ref 12.0–46.0)
Lymphs Abs: 0.6 10*3/uL — ABNORMAL LOW (ref 0.7–4.0)
MCHC: 31.3 g/dL (ref 30.0–36.0)
MCV: 80 fl (ref 78.0–100.0)
Monocytes Absolute: 0.4 10*3/uL (ref 0.1–1.0)
Monocytes Relative: 4.8 % (ref 3.0–12.0)
Neutro Abs: 6.5 10*3/uL (ref 1.4–7.7)
Neutrophils Relative %: 83.8 % — ABNORMAL HIGH (ref 43.0–77.0)
Platelets: 415 10*3/uL — ABNORMAL HIGH (ref 150.0–400.0)
RBC: 4.24 Mil/uL (ref 4.22–5.81)
RDW: 26.3 % — ABNORMAL HIGH (ref 11.5–15.5)
WBC: 7.8 10*3/uL (ref 4.0–10.5)

## 2020-01-20 MED ORDER — PLENVU 140 G PO SOLR: 1.0000 | Freq: Once | ORAL | 0 refills | Status: AC

## 2020-01-20 NOTE — Progress Notes (Signed)
01/20/2020 Erik Clay [id number] May 29, 1960   Chief Complaint: Schedule EGD and colonoscopy   History of Present Illness: Erik Clay is a 59 year old male with a past medical history of anemia, GERD, dysphagia and a gluteal abscess s/p I & D 01/28/2018, 09/23/2018 and 08/31/2019.  He was last seen in our office by Dr. Myrtie Neither on 05/18/2019 for further evaluation regarding normocytic anemia.  At that time, an EGD and colonoscopy were recommended but the patient was unable to schedule as he was uninsured and he applied for [employer] card assistance.  He was assessed to have an anorectal lesion and he was referred to surgeon Dr. Marin Olp with concerns of a recurrent gluteal abscess.  It is unclear if the patient was seen by Dr. Cliffton Asters as recommended.  However, he developed recurrence of left buttock pain with confirmed perirectal abscess s/p I & D at the urgent care clinic on 08/31/2019 he was placed on Bactrim twice daily for 10 days.  He stated his gluteal abscess resolved without the recurrence.   Currently, he has [employer] assistance and he wishes to schedule an EGD and colonoscopy as previously recommended.  He denies having any nausea or vomiting.  No heartburn.  He has dysphagia described as food gets stuck in his throat for 1 or 2 minutes then passes down the esophagus which may occur once weekly.  He is taking Omeprazole 20 mg once daily.  He denies having any upper or lower abdominal pain.  He is passing 1-2 brown or black formed bowel movements daily.  He stated his stools are sometimes appear black since starting ferrous sulfate for his anemia.  No rectal bleeding.  No loose black stools.  No NSAID use.  He lives alone.  His appetite is good. He has lost 9 lbs over the past 2 months. He eats most of his meals away from home.  He drinks 2-3 beers twice weekly.  No drug use.  He smokes 1/2 pack of cigarettes daily.  Family history of esophageal, gastric or  colon cancer.   CBC Latest Ref Rng & Units 11/05/2019 08/03/2019 04/20/2019  WBC 3.4 - 10.8 x10E3/uL 12.9(H) 7.7 8.8  Hemoglobin 13.0 - 17.7 g/dL 5.4(M) 0.8(Q) 7.6(P)  Hematocrit 37.5 - 51.0 % 32.6(L) 34.3(L) 33.2(L)  Platelets 150 - 450 x10E3/uL 386 273 478(H)  MCV 80 Iron 11.  Iron saturation 3.  TIBC 404.  B12 404.  Folate 5.1.  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 This measures a maximum of 8.3 x 5.0 cm. MRI without and with contrast may be helpful for further evaluation.  Current Outpatient Medications on File Prior  to Visit  Medication Sig Dispense Refill  . acetaminophen (TYLENOL) 500 MG tablet Take 2 tablets (1,000 mg total) by mouth every 6 (six) hours. 30 tablet 0  . albuterol (VENTOLIN HFA) 108 (90 Base) MCG/ACT inhaler Inhale 2 puffs into the lungs every 6 (six) hours as needed for wheezing or shortness of breath. 18 g 1  . amLODipine (NORVASC) 10 MG tablet Take 1 tablet (10 mg total) by mouth daily. 90 tablet 1  . ibuprofen (ADVIL) 200 MG tablet Take 200 mg by mouth every 6 (six) hours as needed.    . Iron, Ferrous Sulfate, 325 (65 Fe) MG TABS Take 1 tablet by mouth in the morning and at bedtime. 60 tablet 0  . losartan (COZAAR) 100 MG tablet Take 1 tablet (100 mg total) by mouth daily. 90 tablet 1  . omeprazole (PRILOSEC) 20 MG capsule Take 1 capsule (20 mg total) by mouth daily. 90  capsule 1  . nicotine (NICODERM CQ - DOSED IN MG/24 HOURS) 14 mg/24hr patch Place 1 patch (14 mg total) onto the skin daily. (Patient not taking: Reported on 01/20/2020) 36 patch 0   Current Facility-Administered Medications on File Prior to Visit  Medication Dose Route Frequency Provider Last Rate Last Admin  . cyanocobalamin ((VITAMIN B-12)) injection 1,000 mcg  1,000 mcg Intramuscular Once Claiborne Rigg, NP       No Known Allergies    Current Medications, Allergies, Past Medical History, Past Surgical History, Family History and Social History were reviewed in [employer] record.   Review of Systems:   Constitutional: Negative for fever, sweats, chills or weight loss.  Respiratory: Negative for shortness of breath.   Cardiovascular: Negative for chest pain, palpitations and leg swelling.  Gastrointestinal: See HPI.  Musculoskeletal: Negative for back pain or muscle aches.  Neurological: Negative for dizziness, headaches or paresthesias.    Physical Exam: BP 106/70   Pulse 76   Ht 5\' 9"  (1.753 m)   Wt 106 lb (48.1 kg)   SpO2 99%   BMI 15.65 kg/m    Wt Readings from Last 3 Encounters:  01/20/20 106 lb (48.1 kg)  01/04/20 107 lb 1.6 oz (48.6 kg)  11/14/19 115 lb (52.2 kg)   General: Thin 59 year old male in no acute distress. Head: Normocephalic and atraumatic. Eyes: Right eye deviation.  No scleral icterus.  Conjunctiva pink . Ears: Normal auditory acuity. Mouth: Poor dentition.  Few remaining teeth do not appear loose at this time.  No ulcers or lesions.  Lungs: Clear throughout to auscultation. Heart: Regular rate and rhythm, no murmur. Abdomen: Soft, flat, lack of adipose tissue.  Nontender and nondistended. No masses or hepatomegaly. Normal bowel sounds x 4 quadrants.  Rectal: Deferred. Musculoskeletal: Symmetrical with no gross deformities. Extremities: No edema. Neurological: Alert oriented x 4. No focal deficits.  Psychological: Alert  and cooperative. Normal mood and affect  Assessment and Recommendations:  63.  59 year old male with iron deficiency anemia -EGD and colonoscopy to rule out upper/lowe GI malignancy and IBD. EGD/colonoscopy  benefits and risks discussed including risk with sedation, risk of bleeding, perforation and infection  -Patient to hold ferrous sulfate for 1 week prior to colonoscopy prep date then restarted after procedures completed -CBC, iron, iron saturation, TIBC and ferritin level  2.  Dysphagia -See plan in #1 -Continue omeprazole 20 mg daily -Patient to call our office if his symptoms worsen  3. Recurrent perianal/gluteal abscess. 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 S/P multiple I & D procedures.  -Follow-up with general surgery if gluteal abscess recurs -Proceed with colonoscopy to rule out Crohn's disease  4.  Smoker.  -Smoking cessation recommended -Follow-up with PCP for chest CT which was recently ordered 11/2019  5. Weight loss -See plan in # 1 and # 2  Further recommendations to be determined after the above evaluation completed

## 2020-01-20 NOTE — Patient Instructions (Addendum)
If you are age 57 or older, your body mass index should be between 23-30. Your Body mass index is 15.65 kg/m. If this is out of the aforementioned range listed, please consider follow up with your Primary Care Provider.  If you are age 46 or younger, your body mass index should be between 19-25. Your Body mass index is 15.65 kg/m. If this is out of the aformentioned range listed, please consider follow up with your Primary Care Provider.   Your provider has requested that you go to the basement level for lab work before leaving today. Press "B" on the elevator. The lab is located at the first door on the left as you exit the elevator.   Hold Iron for 1 week prior to procedure.   Follow up with PCP about chest CT scan.

## 2020-01-21 LAB — IRON, TOTAL/TOTAL IRON BINDING CAP
%SAT: 94 % (calc) — ABNORMAL HIGH (ref 20–48)
Iron: 392 ug/dL — ABNORMAL HIGH (ref 50–180)
TIBC: 415 mcg/dL (calc) (ref 250–425)

## 2020-01-21 LAB — COMPREHENSIVE METABOLIC PANEL
ALT: 6 U/L (ref 0–53)
AST: 13 U/L (ref 0–37)
Albumin: 4.1 g/dL (ref 3.5–5.2)
Alkaline Phosphatase: 65 U/L (ref 39–117)
BUN: 15 mg/dL (ref 6–23)
CO2: 24 mEq/L (ref 19–32)
Calcium: 9.1 mg/dL (ref 8.4–10.5)
Chloride: 105 mEq/L (ref 96–112)
Creatinine, Ser: 1.44 mg/dL (ref 0.40–1.50)
GFR: 54 mL/min — ABNORMAL LOW (ref >60.00)
Glucose, Bld: 96 mg/dL (ref 70–99)
Potassium: 4.6 mEq/L (ref 3.5–5.1)
Sodium: 137 mEq/L (ref 135–145)
Total Bilirubin: 0.4 mg/dL (ref 0.2–1.2)
Total Protein: 7.5 g/dL (ref 6.0–8.3)

## 2020-01-21 LAB — IRON: Iron: 387 ug/dL — ABNORMAL HIGH (ref 42–165)

## 2020-01-21 LAB — FERRITIN: Ferritin: 15.6 ng/mL — ABNORMAL LOW (ref 22.0–322.0)

## 2020-01-21 NOTE — Progress Notes (Signed)
____________________________________________________________  Attending physician addendum:  Thank you for sending this case to me. I have reviewed the entire note and agree with the plan.  Challenging case on multiple levels.  Amada Jupiter, MD  ____________________________________________________________

## 2020-01-24 MED FILL — OMEPRAZOLE 20 MG CAP: 20 | 30 days supply | Qty: 30 | Fill #1

## 2020-01-24 MED FILL — LOSARTAN POTASSIUM 100 MG T: 100 | 30 days supply | Qty: 30 | Fill #1

## 2020-02-03 MED FILL — AMLODIPINE BESYLATE 10 MG T: 10 | 30 days supply | Qty: 30 | Fill #1

## 2020-02-15 MED FILL — AMLODIPINE BESYLATE 10 MG T: 10 | 30 days supply | Qty: 30 | Fill #1

## 2020-03-08 ENCOUNTER — Other Ambulatory Visit: Payer: Self-pay | Admitting: Nurse Practitioner

## 2020-03-08 DIAGNOSIS — I10 Essential (primary) hypertension: Secondary | ICD-10-CM

## 2020-03-08 DIAGNOSIS — D649 Anemia, unspecified: Secondary | ICD-10-CM

## 2020-03-08 MED ORDER — IRON (FERROUS SULFATE) 325 (65 FE) MG PO TABS: 1.0000 | ORAL_TABLET | Freq: Two times a day (BID) | ORAL | 0 refills | Status: AC

## 2020-03-08 MED ORDER — AMLODIPINE BESYLATE 10 MG PO TABS: 10.0000 mg | ORAL_TABLET | Freq: Every day | ORAL | 1 refills | Status: AC

## 2020-03-08 NOTE — Telephone Encounter (Signed)
Medication  amLODipine (NORVASC) 10 MG tablet [[id number]]    Iron, Ferrous Sulfate, 325 (65 Fe) MG TABS [[id number]]  Has the patient contacted their pharmacy? No  (Agent: If no, request that the patient contact the pharmacy for the refill.)   Preferred Pharmacy (with phone number or street name):  [facility] - [place], [place] - [address]  [address], [place] [address]  Phone:  [phone] Fax:  [phone]  Agent: Please be advised that RX refills may take up to 3 business days. We ask that you follow-up with your pharmacy.

## 2020-03-10 ENCOUNTER — Encounter: Payer: Self-pay | Admitting: Gastroenterology

## 2020-03-13 ENCOUNTER — Telehealth: Payer: Self-pay | Admitting: Nurse Practitioner

## 2020-03-13 NOTE — Telephone Encounter (Signed)
Patient cancelled EGD and colonoscopy with Dr. Myrtie Neither. See letter mailed to patient to reschedule EGD and colonoscopy asap.

## 2020-04-06 ENCOUNTER — Inpatient Hospital Stay: Payer: 59 | Admitting: Oncology

## 2020-04-06 ENCOUNTER — Inpatient Hospital Stay: Payer: 59 | Attending: Oncology

## 2020-05-31 ENCOUNTER — Other Ambulatory Visit: Payer: Self-pay

## 2020-05-31 ENCOUNTER — Ambulatory Visit (HOSPITAL_COMMUNITY)
Admission: EM | Admit: 2020-05-31 | Discharge: 2020-05-31 | Disposition: A | Discharge status: Home / Self Care | Payer: 59 | Attending: Family Medicine | Admitting: Family Medicine

## 2020-05-31 ENCOUNTER — Encounter (HOSPITAL_COMMUNITY): Payer: Self-pay | Admitting: Emergency Medicine

## 2020-05-31 DIAGNOSIS — R509 Fever, unspecified: Secondary | ICD-10-CM | POA: Diagnosis not present

## 2020-05-31 DIAGNOSIS — L0231 Cutaneous abscess of buttock: Secondary | ICD-10-CM | POA: Diagnosis not present

## 2020-05-31 MED ORDER — SULFAMETHOXAZOLE-TRIMETHOPRIM 800-160 MG PO TABS: 1.0000 | ORAL_TABLET | Freq: Two times a day (BID) | ORAL | 0 refills | Status: AC

## 2020-05-31 MED ORDER — HIBICLENS 4 % EX LIQD: Freq: Every day | CUTANEOUS | 0 refills | Status: AC | PRN

## 2020-05-31 NOTE — ED Triage Notes (Signed)
Patient presents to [facility] for evaluation fo abscess to left buttock x 3 days.  Denies fevers.  States ibuprofen and warm baths have helped some.

## 2020-05-31 NOTE — ED Provider Notes (Signed)
[facility]    CSN: [id number] Arrival date & time: 05/31/20  1623      History   Chief Complaint Chief Complaint  Patient presents with  . Abscess    HPI Erik Clay is a 60 y.o. male.   Patient presenting today with 3-day history of left buttock pain and swelling in area of known recurrent gluteal abscesses.  States last time he had a flareup was about a year ago.  Denies drainage from the area, injury to the area sweats, body aches.  Has not been trying anything other than some Advil for symptoms at this point.  Has had numerous I&D's in the area with good temporary relief.     Past Medical History:  Diagnosis Date  . GERD (gastroesophageal reflux disease)   . Hypertension   . Perirectal abscess     Patient Active Problem List   Diagnosis Date Noted  . IDA (iron deficiency anemia) 01/20/2020  . Dysphagia 01/20/2020  . Perirectal abscess 09/23/2018  . Essential hypertension 12/08/2016  . Gastroesophageal reflux disease 12/08/2016    Past Surgical History:  Procedure Laterality Date  . INCISION AND DRAINAGE ABSCESS N/A 09/23/2018   Procedure: INCISION AND DRAINAGE PERIRECTAL ABSCESS;  Surgeon: Andria Meuse, MD;  Location: [facility];  Service: General;  Laterality: N/A;  . INCISION AND DRAINAGE PERIRECTAL ABSCESS N/A 01/28/2018   Procedure: IRRIGATION AND DEBRIDEMENT PERIRECTAL ABSCESS;  Surgeon: Sheliah Hatch De Blanch, MD;  Location: [facility];  Service: General;  Laterality: N/A;  . NO PAST SURGERIES         Home Medications    Prior to Admission medications   Medication Sig Start Date End Date Taking? Authorizing Provider  chlorhexidine (HIBICLENS) 4 % external liquid Apply topically daily as needed. 05/31/20  Yes Particia Nearing, PA-C  sulfamethoxazole-trimethoprim (BACTRIM DS) 800-160 MG tablet Take 1 tablet by mouth 2 (two) times daily for 7 days. 05/31/20 06/07/20 Yes Particia Nearing, PA-C  acetaminophen (TYLENOL) 500 MG tablet  Take 2 tablets (1,000 mg total) by mouth every 6 (six) hours. 09/26/18   Luretha Murphy, MD  albuterol (VENTOLIN HFA) 108 (90 Base) MCG/ACT inhaler Inhale 2 puffs into the lungs every 6 (six) hours as needed for wheezing or shortness of breath. 11/22/19   Claiborne Rigg, NP  amLODipine (NORVASC) 10 MG tablet Take 1 tablet (10 mg total) by mouth daily. 03/08/20 06/06/20  Claiborne Rigg, NP  ibuprofen (ADVIL) 200 MG tablet Take 200 mg by mouth every 6 (six) hours as needed.    [provider]  Iron, Ferrous Sulfate, 325 (65 Fe) MG TABS Take 1 tablet by mouth in the morning and at bedtime. 03/08/20   Claiborne Rigg, NP  losartan (COZAAR) 100 MG tablet Take 1 tablet (100 mg total) by mouth daily. 11/05/19 02/03/20  Claiborne Rigg, NP  nicotine (NICODERM CQ - DOSED IN MG/24 HOURS) 14 mg/24hr patch Place 1 patch (14 mg total) onto the skin daily. Patient not taking: No sig reported 09/01/17   Claiborne Rigg, NP  omeprazole (PRILOSEC) 20 MG capsule Take 1 capsule (20 mg total) by mouth daily. 11/05/19 02/03/20  Claiborne Rigg, NP    Family History Family History  Problem Relation Age of Onset  . Diabetes Mother   . Hypertension Mother   . Hypertension Father   . Diabetes Sister   . Hypertension Sister   . Hypertension Brother   . Hypertension Sister   . Colon cancer  Neg Hx   . Pancreatic cancer Neg Hx   . Esophageal cancer Neg Hx     Social History Social History   Tobacco Use  . Smoking status: Current Every Day Smoker    Packs/day: 0.50    Types: Cigarettes  . Smokeless tobacco: Never Used  Vaping Use  . Vaping Use: Never used  Substance Use Topics  . Alcohol use: Not Currently    Alcohol/week: 1.0 standard drink    Types: 1 Cans of beer per week    Comment: 1-2 per day  . Drug use: No     Allergies   Patient has no known allergies.   Review of Systems Review of Systems Per HPI  Physical Exam Triage Vital Signs ED Triage Vitals  Enc Vitals Group     BP  05/31/20 1704 111/74     Pulse Rate 05/31/20 1704 92     Resp 05/31/20 1704 18     Temp 05/31/20 1704 (!) 100.4 F (38 C)     Temp Source 05/31/20 1704 Oral     SpO2 05/31/20 1704 95 %     Weight --      Height --      Head Circumference --      Peak Flow --      Pain Score 05/31/20 1702 8     Pain Loc --      Pain Edu? --      Excl. in GC? --    No data found.  Updated Vital Signs BP 111/74 (BP Location: Left Arm)   Pulse 92   Temp (!) 100.4 F (38 C) (Oral)   Resp 18   SpO2 95%   Visual Acuity Right Eye Distance:   Left Eye Distance:   Bilateral Distance:    Right Eye Near:   Left Eye Near:    Bilateral Near:     Physical Exam Vitals and nursing note reviewed.  Constitutional:      Appearance: Normal appearance.  HENT:     Head: Atraumatic.     Nose: Nose normal.     Mouth/Throat:     Mouth: Mucous membranes are moist.     Pharynx: Oropharynx is clear. No posterior oropharyngeal erythema.  Eyes:     Extraocular Movements: Extraocular movements intact.     Conjunctiva/sclera: Conjunctivae normal.  Cardiovascular:     Rate and Rhythm: Normal rate and regular rhythm.     Heart sounds: Normal heart sounds.  Pulmonary:     Effort: Pulmonary effort is normal.     Breath sounds: Normal breath sounds. No wheezing or rales.  Genitourinary:   Musculoskeletal:        General: Normal range of motion.     Cervical back: Normal range of motion and neck supple.  Skin:    General: Skin is warm and dry.  Neurological:     General: No focal deficit present.     Mental Status: He is oriented to person, place, and time.  Psychiatric:        Mood and Affect: Mood normal.        Thought Content: Thought content normal.        Judgment: Judgment normal.    UC Treatments / Results  Labs (all labs ordered are listed, but only abnormal results are displayed) Labs Reviewed - No data to display  EKG   Radiology No results found.  Procedures Incision and  Drainage  Date/Time: 05/31/2020 6:00 PM Performed by:  Particia Nearing, PA-C Authorized by: Particia Nearing, PA-C   Consent:    Consent obtained:  Verbal   Consent given by:  Patient   Risks, benefits, and alternatives were discussed: yes     Risks discussed:  Bleeding, incomplete drainage, pain and infection   Alternatives discussed:  Alternative treatment Universal protocol:    Procedure explained and questions answered to patient or proxy's satisfaction: yes     Relevant documents present and verified: yes     Test results available : yes     Site/side marked: yes     Immediately prior to procedure, a time out was called: yes     Patient identity confirmed:  Verbally with patient and arm band Location:    Type:  Abscess   Size:  8 cm   Location: left buttock. Pre-procedure details:    Skin preparation:  Antiseptic wash and chlorhexidine with alcohol Sedation:    Sedation type:  None Anesthesia:    Anesthesia method:  Local infiltration   Local anesthetic:  Lidocaine 2% WITH epi Procedure type:    Complexity:  Simple Procedure details:    Ultrasound guidance: no     Needle aspiration: no     Incision types:  Stab incision   Incision depth:  Dermal   Wound management:  Probed and deloculated   Drainage:  Purulent   Drainage amount:  Copious   Wound treatment:  Wound left open   Packing materials:  None Post-procedure details:    Procedure completion:  Tolerated well, no immediate complications   (including critical care time)  Medications Ordered in UC Medications - No data to display  Initial Impression / Assessment and Plan / UC Course  I have reviewed the triage vital signs and the nursing notes.  Pertinent labs & imaging results that were available during my care of the patient were reviewed by me and considered in my medical decision making (see chart for details).     Patient febrile in triage today, discussed importance of over-the-counter  fever reducers and continued monitoring of this.  I&D performed today with his consent, copious amounts of purulent drainage expressed during procedure.  Will start Bactrim, Hibiclens, warm Epsom salt soaks.  Discussed if he is worsening at any point that he needs to go to the ED immediately.  Follow-up with soon as possible with [facility] [place] surgery for recheck and further management as this is a recurrent issue for him.  Final Clinical Impressions(s) / UC Diagnoses   Final diagnoses:  Abscess of buttock, left  Fever, unspecified     Discharge Instructions     Go directly to the ER if her symptoms worsen any time.  [facility] surgery for follow-up tomorrow morning as soon as they can get you scheduled.    ED Prescriptions    Medication Sig Dispense Auth. Provider   sulfamethoxazole-trimethoprim (BACTRIM DS) 800-160 MG tablet Take 1 tablet by mouth 2 (two) times daily for 7 days. 14 tablet Particia Nearing, [place]   chlorhexidine (HIBICLENS) 4 % external liquid Apply topically daily as needed. 120 mL Particia Nearing, [place]     PDMP not reviewed this encounter.   Particia Nearing, [place] 05/31/20 1947

## 2020-05-31 NOTE — Discharge Instructions (Signed)
Go directly to the ER if her symptoms worsen any time.  [facility] surgery for follow-up tomorrow morning as soon as they can get you scheduled.

## 2020-07-19 IMAGING — CT CT PELVIS WITHOUT CONTRAST
2 of 4 series · 16 of 46 positions shown, 18 images · non-contrast
Comparison: CT scan 01/28/2018

CLINICAL DATA: Left buttock and peroneal abscess.

EXAM:
CT PELVIS WITHOUT CONTRAST
TECHNIQUE: Multidetector CT imaging of the pelvis was performed following the
standard protocol without intravenous contrast.

[Series 4: pelvis w/o 1mm · axial · non-contrast · 0.71mm/px · z∈[+845,+1157]mm · 13 of 338 slices shown, 15 images]
[im 13/338  soft-tissue]
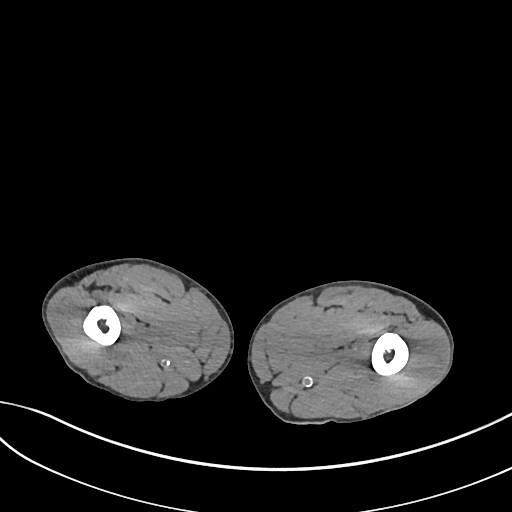
[im 13/338  bone]
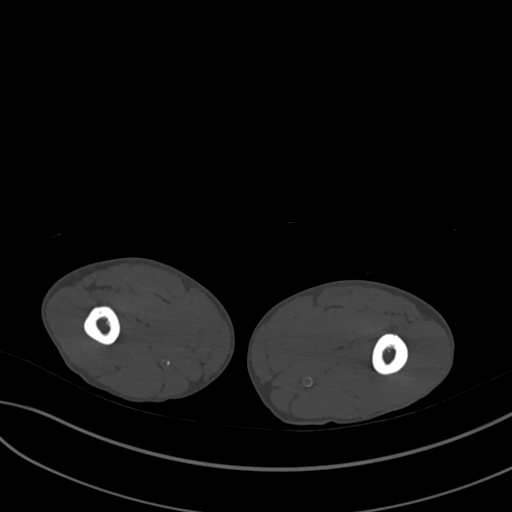
[im 39/338  soft-tissue]
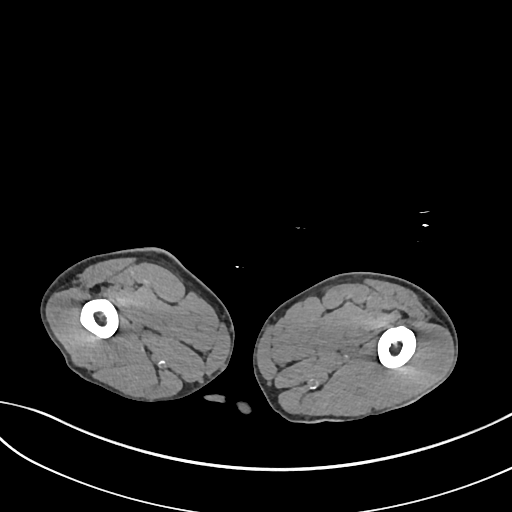
[im 65/338  soft-tissue]
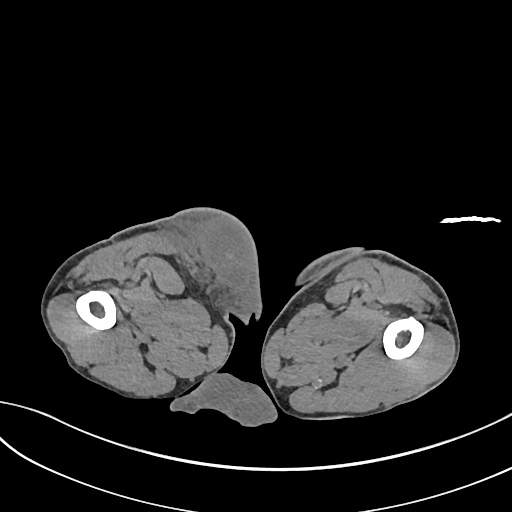
[im 91/338  soft-tissue]
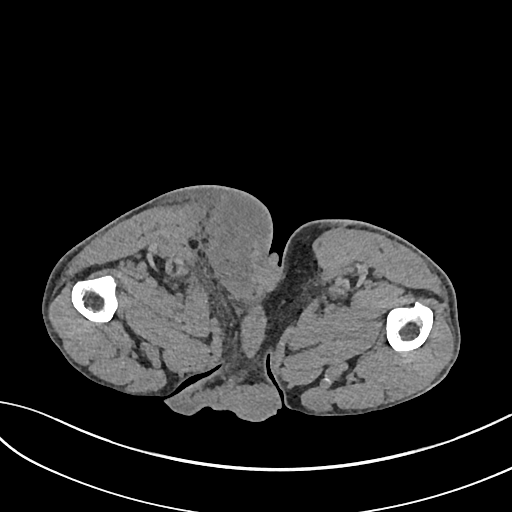
[im 117/338  soft-tissue]
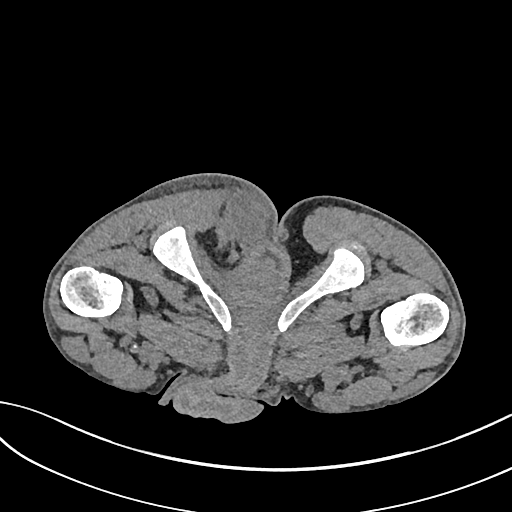
[im 143/338  soft-tissue]
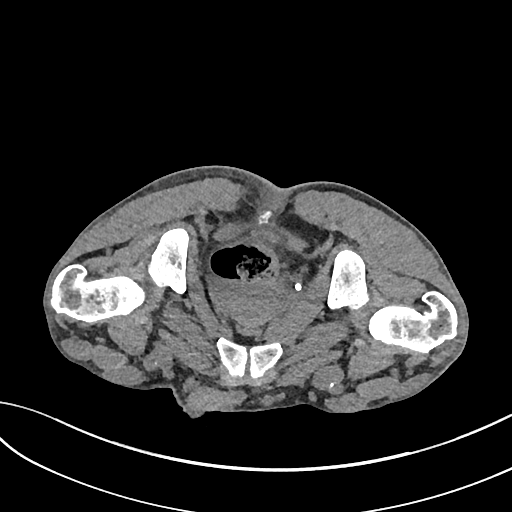
[im 169/338  soft-tissue]
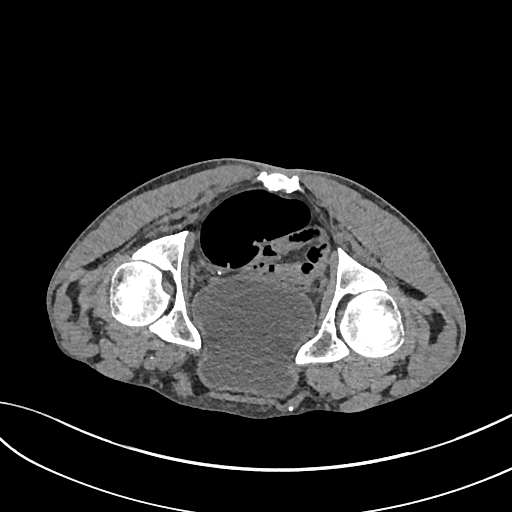
[im 195/338  soft-tissue]
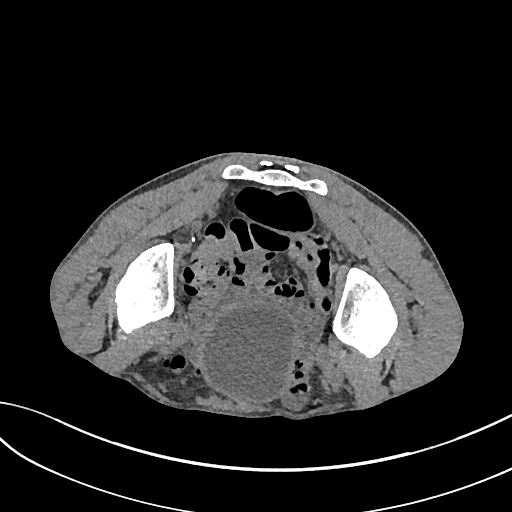
[im 221/338  soft-tissue]
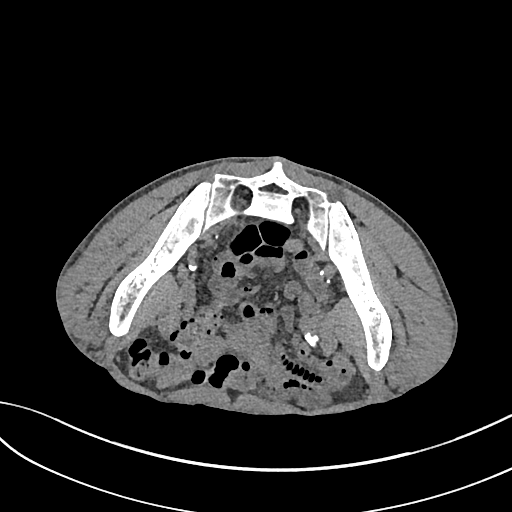
[im 221/338  bone]
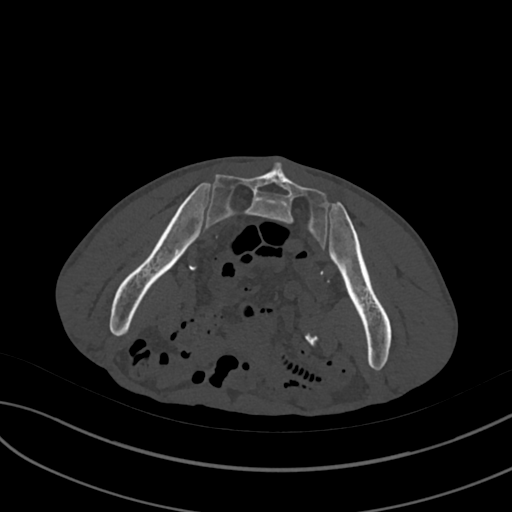
[im 247/338  soft-tissue]
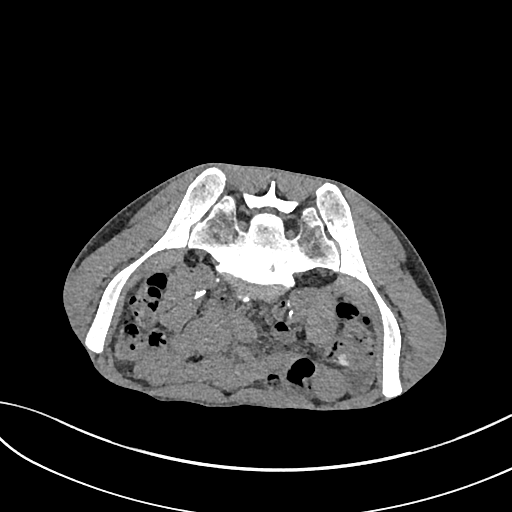
[im 273/338  soft-tissue]
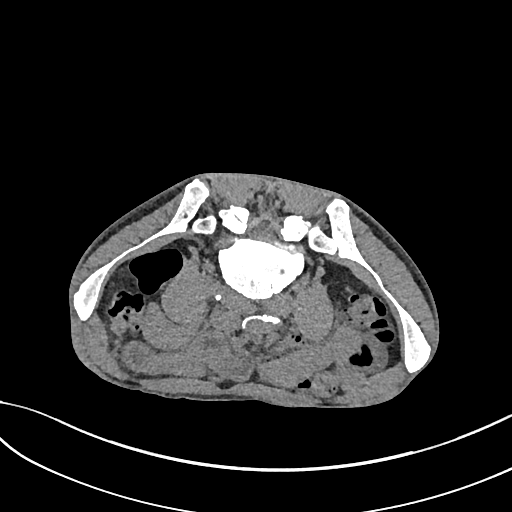
[im 299/338  soft-tissue]
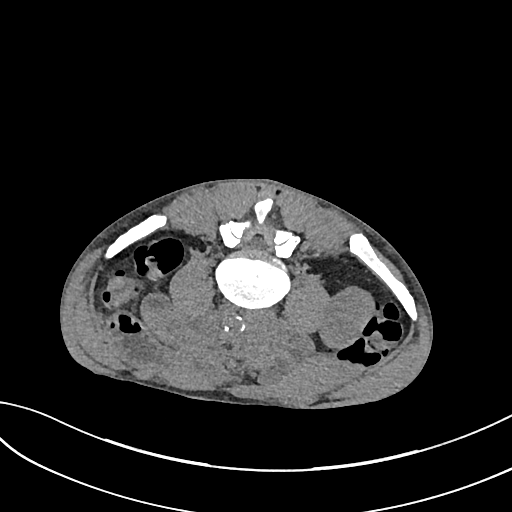
[im 325/338  soft-tissue]
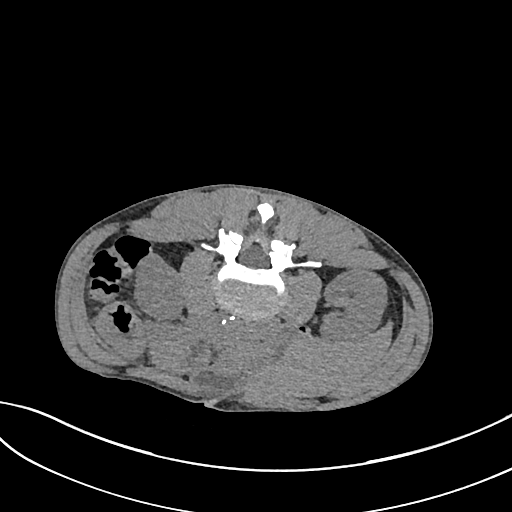

[Series 5: pelvis w/o 2.0 cor · coronal · non-contrast · 0.59mm/px · 3 of 120 slices shown]
[im 40/120  soft-tissue]
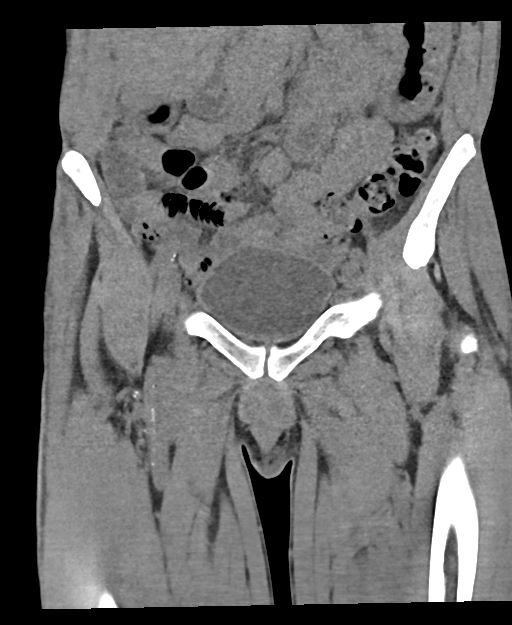
[im 53/120  soft-tissue]
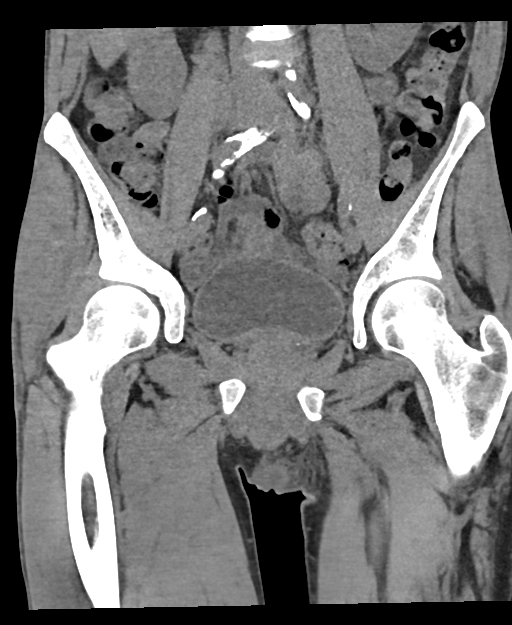
[im 67/120  soft-tissue]
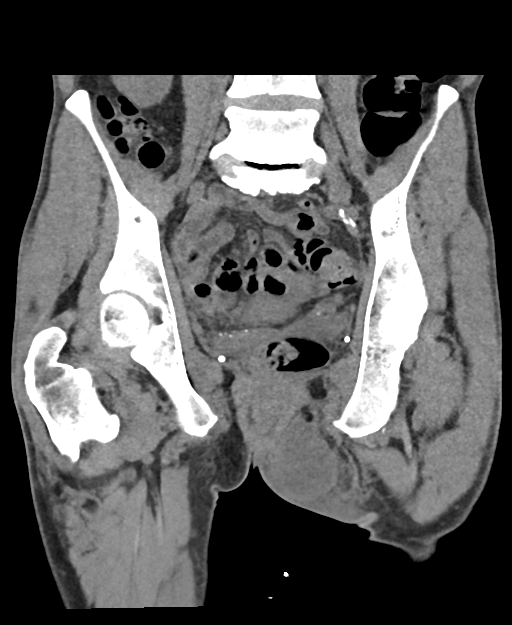

[16 of 46 positions shown; findings below may reference images not displayed]

FINDINGS: Urinary Tract: The bladder is grossly normal. No bladder calculi or
mass. A right renal calculus is noted. No hydroureteronephrosis.

Bowel: The visualized small bowel and colon are grossly normal
without oral contrast. Colonic diverticulosis is noted.

Vascular/Lymphatic: Significant age advanced atherosclerotic
calcifications involving the lower aorta and iliac and common
femoral arteries. No inguinal or pelvic adenopathy. Scattered lymph
nodes are noted.

Reproductive: The prostate gland and seminal vesicles are
unremarkable.

Other: There is a large complex perianal fistula and left buttock
and peroneal abscess. This measures a maximum of 8.3 x 5.0 cm. MRI
without and with contrast may be helpful for further evaluation.

There appears to be some prolapse of the rectal mucosa or
hemorrhoids.

Musculoskeletal: No significant bony findings. Advanced degenerate
disc disease noted at L5-S1.
IMPRESSION: 1. Left-sided perianal fistula and complex abscess similar to prior
study from 7183. MRI pelvis without and with contrast may be helpful
for more definitive evaluation and pre-surgical planning.
2. Age advanced vascular calcifications.
3. Right renal calculus.
4. No intrapelvic abscess or adenopathy.

## 2020-08-17 ENCOUNTER — Emergency Department (HOSPITAL_COMMUNITY)
Admission: EM | Admit: 2020-08-17 | Discharge: 2020-08-18 | Disposition: A | Discharge status: Home / Self Care | Payer: 59 | Attending: Emergency Medicine | Admitting: Emergency Medicine

## 2020-08-17 ENCOUNTER — Other Ambulatory Visit: Payer: Self-pay

## 2020-08-17 ENCOUNTER — Encounter (HOSPITAL_COMMUNITY): Payer: Self-pay

## 2020-08-17 ENCOUNTER — Ambulatory Visit (HOSPITAL_COMMUNITY)
Admission: EM | Admit: 2020-08-17 | Discharge: 2020-08-17 | Disposition: A | Discharge status: Home / Self Care | Payer: 59 | Attending: Internal Medicine | Admitting: Internal Medicine

## 2020-08-17 DIAGNOSIS — L0231 Cutaneous abscess of buttock: Secondary | ICD-10-CM | POA: Diagnosis not present

## 2020-08-17 DIAGNOSIS — K610 Anal abscess: Secondary | ICD-10-CM

## 2020-08-17 DIAGNOSIS — I10 Essential (primary) hypertension: Secondary | ICD-10-CM | POA: Diagnosis not present

## 2020-08-17 DIAGNOSIS — Z79899 Other long term (current) drug therapy: Secondary | ICD-10-CM | POA: Insufficient documentation

## 2020-08-17 DIAGNOSIS — F17210 Nicotine dependence, cigarettes, uncomplicated: Secondary | ICD-10-CM | POA: Diagnosis not present

## 2020-08-17 NOTE — Discharge Instructions (Addendum)
Patient is advised to go to the emergency department for further evaluation 

## 2020-08-17 NOTE — ED Notes (Signed)
Patient is being discharged from the Urgent Care and sent to the Emergency Department via POV . Per Dr Leonides Grills, patient is in need of higher level of care due to extent of hemorrhoid. Patient is aware and verbalizes understanding of plan of care.  Vitals:   08/17/20 1454  BP: 124/82  Pulse: (!) 102  Resp: 18  Temp: 99.4 F (37.4 C)  SpO2: 97%

## 2020-08-17 NOTE — ED Triage Notes (Signed)
Pt c/o abscess on L buttock x2 days. States he's used hot bath w no relief. Seen at [facility] for same, sent here for I&D

## 2020-08-17 NOTE — ED Triage Notes (Signed)
Pt c/o abscess on the left buttock X 2 days. Pt states he tried taking a hot bath for relief.

## 2020-08-18 ENCOUNTER — Other Ambulatory Visit: Payer: Self-pay

## 2020-08-18 MED ORDER — LIDOCAINE-EPINEPHRINE 1 %-1:100000 IJ SOLN
10.0000 mL | Freq: Once | INTRAMUSCULAR | Status: AC
  Administered 2020-08-18: 10 mL via INTRADERMAL
  Filled 2020-08-18: qty 1

## 2020-08-18 MED ORDER — HYDROCORTISONE (PERIANAL) 2.5 % EX CREA: 1.0000 "application " | TOPICAL_CREAM | Freq: Two times a day (BID) | CUTANEOUS | 0 refills | Status: AC

## 2020-08-18 NOTE — ED Provider Notes (Signed)
[facility]    CSN: [id number] Arrival date & time: 08/17/20  1438      History   Chief Complaint Chief Complaint  Patient presents with   Abscess    HPI Erik Clay is a 60 y.o. male comes to the urgent care with painful swelling in the right gluteal/perianal area.  Symptoms started about 2 weeks ago.  Pain is throbbing, constant, aggravated by movement and certain.  No known relieving factors.  No surrounding erythema.  No fever or chills.  This is apparently a recurrent problem for the patient.  Patient denies any drainage from the site.  HPI  Past Medical History:  Diagnosis Date   GERD (gastroesophageal reflux disease)    Hypertension    Perirectal abscess     Patient Active Problem List   Diagnosis Date Noted   IDA (iron deficiency anemia) 01/20/2020   Dysphagia 01/20/2020   Perirectal abscess 09/23/2018   Essential hypertension 12/08/2016   Gastroesophageal reflux disease 12/08/2016    Past Surgical History:  Procedure Laterality Date   INCISION AND DRAINAGE ABSCESS N/A 09/23/2018   Procedure: INCISION AND DRAINAGE PERIRECTAL ABSCESS;  Surgeon: Andria Meuse, MD;  Location: [facility];  Service: General;  Laterality: N/A;   INCISION AND DRAINAGE PERIRECTAL ABSCESS N/A 01/28/2018   Procedure: IRRIGATION AND DEBRIDEMENT PERIRECTAL ABSCESS;  Surgeon: Sheliah Hatch De Blanch, MD;  Location: [facility];  Service: General;  Laterality: N/A;   NO PAST SURGERIES         Home Medications    Prior to Admission medications   Medication Sig Start Date End Date Taking? Authorizing Provider  acetaminophen (TYLENOL) 500 MG tablet Take 2 tablets (1,000 mg total) by mouth every 6 (six) hours. 09/26/18   Luretha Murphy, MD  albuterol (VENTOLIN HFA) 108 (90 Base) MCG/ACT inhaler INHALE 2 PUFFS INTO THE LUNGS EVERY 6 (SIX) HOURS AS NEEDED FOR WHEEZING OR SHORTNESS OF BREATH. 11/22/19 11/21/20  Claiborne Rigg, NP  amLODipine (NORVASC) 10 MG tablet Take 1 tablet (10  mg total) by mouth daily. 03/08/20 06/06/20  Claiborne Rigg, NP  chlorhexidine (HIBICLENS) 4 % external liquid Apply topically daily as needed. 05/31/20   Particia Nearing, PA-C  hydrocortisone (ANUSOL-HC) 2.5 % rectal cream Place 1 application rectally 2 (two) times daily. 08/18/20   Derrel Nip, MD  ibuprofen (ADVIL) 200 MG tablet Take 200 mg by mouth every 6 (six) hours as needed.    [provider]  Iron, Ferrous Sulfate, 325 (65 Fe) MG TABS Take 1 tablet by mouth in the morning and at bedtime. 03/08/20   Claiborne Rigg, NP  losartan (COZAAR) 100 MG tablet Take 1 tablet (100 mg total) by mouth daily. 11/05/19 02/03/20  Claiborne Rigg, NP  nicotine (NICODERM CQ - DOSED IN MG/24 HOURS) 14 mg/24hr patch Place 1 patch (14 mg total) onto the skin daily. Patient not taking: No sig reported 09/01/17   Claiborne Rigg, NP  omeprazole (PRILOSEC) 20 MG capsule Take 1 capsule (20 mg total) by mouth daily. 11/05/19 02/03/20  Claiborne Rigg, NP    Family History Family History  Problem Relation Age of Onset   Diabetes Mother    Hypertension Mother    Hypertension Father    Diabetes Sister    Hypertension Sister    Hypertension Brother    Hypertension Sister    Colon cancer Neg Hx    Pancreatic cancer Neg Hx    Esophageal cancer Neg Hx  Social History Social History   Tobacco Use   Smoking status: Every Day    Packs/day: 0.50    Pack years: 0.00    Types: Cigarettes   Smokeless tobacco: Never  Vaping Use   Vaping Use: Never used  Substance Use Topics   Alcohol use: Not Currently    Alcohol/week: 1.0 standard drink    Types: 1 Cans of beer per week    Comment: 1-2 per day   Drug use: No     Allergies   Patient has no known allergies.   Review of Systems Review of Systems  Constitutional: Negative.   Respiratory: Negative.    Skin:  Negative for color change, pallor, rash and wound.    Physical Exam Triage Vital Signs ED Triage Vitals  Enc Vitals Group      BP 08/17/20 1454 124/82     Pulse Rate 08/17/20 1454 (!) 102     Resp 08/17/20 1454 18     Temp 08/17/20 1454 99.4 F (37.4 C)     Temp Source 08/17/20 1454 Oral     SpO2 08/17/20 1454 97 %     Weight --      Height --      Head Circumference --      Peak Flow --      Pain Score 08/17/20 1453 9     Pain Loc --      Pain Edu? --      Excl. in GC? --    No data found.  Updated Vital Signs BP 124/82 (BP Location: Right Arm)   Pulse (!) 102   Temp 99.4 F (37.4 C) (Oral)   Resp 18   SpO2 97%   Visual Acuity Right Eye Distance:   Left Eye Distance:   Bilateral Distance:    Right Eye Near:   Left Eye Near:    Bilateral Near:     Physical Exam Vitals and nursing note reviewed.  Constitutional:      General: He is not in acute distress.    Appearance: He is not ill-appearing.  Cardiovascular:     Rate and Rhythm: Normal rate and regular rhythm.  Genitourinary:    Comments: Large left gluteal abscess.  Area is fluctuant.  Measures about 6 x 6 inches. Neurological:     Mental Status: He is alert.     UC Treatments / Results  Labs (all labs ordered are listed, but only abnormal results are displayed) Labs Reviewed - No data to display  EKG   Radiology No results found.  Procedures Procedures (including critical care time)  Medications Ordered in UC Medications - No data to display  Initial Impression / Assessment and Plan / UC Course  I have reviewed the triage vital signs and the nursing notes.  Pertinent labs & imaging results that were available during my care of the patient were reviewed by me and considered in my medical decision making (see chart for details).     Left gluteal abscess: Given the size of the abscess and the proximity to the perianal region, patient is advised to go to the emergency department for  incision and drainage.  Patient agrees to the plan Final Clinical Impressions(s) / UC Diagnoses   Final diagnoses:  Perianal  abscess     Discharge Instructions      Patient is advised to go to the emergency department for further evaluation.   ED Prescriptions   None    PDMP not reviewed  this encounter.   Merrilee Jansky, MD 08/18/20 1758

## 2020-08-18 NOTE — ED Notes (Signed)
I & D site packed by provider, dressed with guaze pad and paper tape, pt supplied with additional guaze and tape to change dressing as needed. Pt educated on wound care. Voiced understanding

## 2020-08-18 NOTE — ED Provider Notes (Signed)
[name] EMERGENCY DEPARTMENT Provider Note   CSN: [id number] Arrival date & time: 08/17/20  1701     History Chief Complaint  Patient presents with   Abscess    Erik Clay is a 60 y.o. male.  Patient presents with recurrence of left buttocks abscess.  He has been seen and had multiple incisions and drainages of this with most recent being in March of this year.  Reports it is considerably tender but denies any other symptoms such as fever, chills, nausea, vomiting, diarrhea, constipation, change in urination.      Past Medical History:  Diagnosis Date   GERD (gastroesophageal reflux disease)    Hypertension    Perirectal abscess     Patient Active Problem List   Diagnosis Date Noted   IDA (iron deficiency anemia) 01/20/2020   Dysphagia 01/20/2020   Perirectal abscess 09/23/2018   Essential hypertension 12/08/2016   Gastroesophageal reflux disease 12/08/2016    Past Surgical History:  Procedure Laterality Date   INCISION AND DRAINAGE ABSCESS N/A 09/23/2018   Procedure: INCISION AND DRAINAGE PERIRECTAL ABSCESS;  Surgeon: Andria Meuse, MD;  Location: [facility] OR;  Service: General;  Laterality: N/A;   INCISION AND DRAINAGE PERIRECTAL ABSCESS N/A 01/28/2018   Procedure: IRRIGATION AND DEBRIDEMENT PERIRECTAL ABSCESS;  Surgeon: Rodman Pickle, MD;  Location: [facility] OR;  Service: General;  Laterality: N/A;   NO PAST SURGERIES         Family History  Problem Relation Age of Onset   Diabetes Mother    Hypertension Mother    Hypertension Father    Diabetes Sister    Hypertension Sister    Hypertension Brother    Hypertension Sister    Colon cancer Neg Hx    Pancreatic cancer Neg Hx    Esophageal cancer Neg Hx     Social History   Tobacco Use   Smoking status: Every Day    Packs/day: 0.50    Pack years: 0.00    Types: Cigarettes   Smokeless tobacco: Never  Vaping Use   Vaping Use: Never used  Substance Use Topics   Alcohol  use: Not Currently    Alcohol/week: 1.0 standard drink    Types: 1 Cans of beer per week    Comment: 1-2 per day   Drug use: No    Home Medications Prior to Admission medications   Medication Sig Start Date End Date Taking? Authorizing Provider  hydrocortisone (ANUSOL-HC) 2.5 % rectal cream Place 1 application rectally 2 (two) times daily. 08/18/20  Yes Derrel Nip, MD  acetaminophen (TYLENOL) 500 MG tablet Take 2 tablets (1,000 mg total) by mouth every 6 (six) hours. 09/26/18   Luretha Murphy, MD  albuterol (VENTOLIN HFA) 108 (90 Base) MCG/ACT inhaler INHALE 2 PUFFS INTO THE LUNGS EVERY 6 (SIX) HOURS AS NEEDED FOR WHEEZING OR SHORTNESS OF BREATH. 11/22/19 11/21/20  Claiborne Rigg, NP  amLODipine (NORVASC) 10 MG tablet Take 1 tablet (10 mg total) by mouth daily. 03/08/20 06/06/20  Claiborne Rigg, NP  chlorhexidine (HIBICLENS) 4 % external liquid Apply topically daily as needed. 05/31/20   Particia Nearing, PA-C  ibuprofen (ADVIL) 200 MG tablet Take 200 mg by mouth every 6 (six) hours as needed.    [provider]  Iron, Ferrous Sulfate, 325 (65 Fe) MG TABS Take 1 tablet by mouth in the morning and at bedtime. 03/08/20   Claiborne Rigg, NP  losartan (COZAAR) 100 MG tablet Take 1 tablet (100  mg total) by mouth daily. 11/05/19 02/03/20  Claiborne Rigg, NP  nicotine (NICODERM CQ - DOSED IN MG/24 HOURS) 14 mg/24hr patch Place 1 patch (14 mg total) onto the skin daily. Patient not taking: No sig reported 09/01/17   Claiborne Rigg, NP  omeprazole (PRILOSEC) 20 MG capsule Take 1 capsule (20 mg total) by mouth daily. 11/05/19 02/03/20  Claiborne Rigg, NP    Allergies    Patient has no known allergies.  Review of Systems   Review of Systems  Constitutional:  Negative for chills and fever.  HENT:  Negative for congestion and rhinorrhea.   Eyes:  Negative for visual disturbance.  Respiratory:  Negative for shortness of breath.   Cardiovascular:  Negative for chest pain.   Gastrointestinal:  Negative for abdominal distention, constipation, diarrhea, nausea and vomiting.  Endocrine: Negative for polyuria.  Genitourinary:  Negative for decreased urine volume.  Musculoskeletal:  Positive for arthralgias.       Left buttocks pain   Physical Exam Updated Vital Signs BP (!) 122/103   Pulse 83   Temp 99 F (37.2 C)   Resp (!) 26   SpO2 98%   Physical Exam Constitutional:      General: He is not in acute distress. HENT:     Head: Normocephalic.     Nose: No congestion or rhinorrhea.     Mouth/Throat:     Mouth: Mucous membranes are moist.  Eyes:     Extraocular Movements: Extraocular movements intact.     Pupils: Pupils are equal, round, and reactive to light.     Comments: Chronic horizontal nystagmus  Cardiovascular:     Rate and Rhythm: Normal rate and regular rhythm.  Pulmonary:     Effort: Pulmonary effort is normal.     Breath sounds: Normal breath sounds.  Abdominal:     General: Abdomen is flat.     Palpations: Abdomen is soft.  Genitourinary:    Comments: Patient has mild amount of rectal prolapse which can be reduced.  He also has a fluctuant abscess on the left buttocks with no cellulitic changes. Musculoskeletal:        General: Normal range of motion.     Cervical back: Normal range of motion and neck supple.  Skin:    General: Skin is warm and dry.     Capillary Refill: Capillary refill takes less than 2 seconds.     Findings: No erythema.  Neurological:     Mental Status: He is alert.    ED Results / Procedures / Treatments   Labs (all labs ordered are listed, but only abnormal results are displayed) Labs Reviewed - No data to display  EKG None  Radiology No results found.  Procedures .Marland KitchenIncision and Drainage  Date/Time: 08/18/2020 9:13 AM Performed by: Derrel Nip, MD Authorized by: Gwyneth Sprout, MD   Consent:    Consent obtained:  Verbal   Consent given by:  Patient   Risks discussed:  Bleeding,  incomplete drainage and pain Location:    Type:  Abscess   Location: Left buttocks. Pre-procedure details:    Skin preparation:  Povidone-iodine Sedation:    Sedation type:  None Anesthesia:    Anesthesia method:  Local infiltration   Local anesthetic:  Lidocaine 1% WITH epi Procedure type:    Complexity:  Complex Procedure details:    Ultrasound guidance: no     Needle aspiration: no     Incision types:  Single straight   Incision  depth:  Dermal   Wound management:  Probed and deloculated   Drainage:  Purulent   Drainage amount:  Copious (50 mL)   Wound treatment:  Drain placed   Packing materials:  1/4 in iodoform gauze   Amount 1/4" iodoform:  12 inches Post-procedure details:    Procedure completion:  Tolerated well, no immediate complications   Medications Ordered in ED Medications  lidocaine-EPINEPHrine (XYLOCAINE W/EPI) 1 %-1:100000 (with pres) injection 10 mL (10 mLs Intradermal Given by Other 08/18/20 2355)    ED Course  I have reviewed the triage vital signs and the nursing notes.  Pertinent labs & imaging results that were available during my care of the patient were reviewed by me and considered in my medical decision making (see chart for details).    MDM Rules/Calculators/A&P                          Patient presents to the emergency department today for left buttocks abscess.  This is a recurrence and most recently was drained on 05/31/2020.  Patient is afebrile on arrival and reports no symptoms of systemic infection.  I&D performed today with copious amounts of purulent drainage.  A drain was placed.  Given no cellulitic changes we will not start antibiotics today.  Patient is to follow-up with [facility] surgery for further management of the recurrent issues.  He is going to follow-up with his primary care provider to have drain removed in 3 to 4 days.  Strict return precautions given. Final Clinical Impression(s) / ED Diagnoses Final diagnoses:   Abscess of buttock, left    Rx / DC Orders ED Discharge Orders          Ordered    hydrocortisone (ANUSOL-HC) 2.5 % rectal cream  2 times daily        08/18/20 0907             Derrel Nip, MD 08/18/20 7322    Gwyneth Sprout, MD 08/18/20 1410

## 2020-08-18 NOTE — Discharge Instructions (Addendum)
It was great seeing you today.  I am sorry you keep having these recurrent abscesses.  We drained the abscess today and you have packing which will need to be in 3 to 4 days.  You can go to your primary care provider to have this removed or just pull it out on your own.  I sent a prescription for Anusol cream to help with the irritation.  I would also like for you to take Sitz baths to help with the rectal prolapse.  Instructions for this are included in this packet.  For pain in your buttocks or the right leg you can take Tylenol arthritic pain.  If you have worsening symptoms, fever, chills please be evaluated.  I have you have a wonderful day!

## 2020-09-08 ENCOUNTER — Telehealth: Payer: Self-pay | Admitting: Nurse Practitioner

## 2020-09-08 ENCOUNTER — Ambulatory Visit: Payer: 59 | Admitting: Nurse Practitioner

## 2020-09-08 NOTE — Telephone Encounter (Signed)
Copied from CRM [id number]. Topic: General - Other >> Sep 08, 2020 11:29 AM Jaquita Rector A wrote: Reason for CRM: Lurlean Nanny with [facility]  called in to inform Bertram Denver that she uploaded patient death certificate so that it can be signed ASAP and they can move forward with process. Any question please call Ph# [phone]
